# Patient Record
Sex: Female | Born: 1955
Health system: Southern US, Community
[De-identification: ages and names within clinical notes are randomized; demographics above are authoritative.]

## PROBLEM LIST (undated history)

## (undated) DIAGNOSIS — C801 Malignant (primary) neoplasm, unspecified: Secondary | ICD-10-CM

## (undated) DIAGNOSIS — H1851 Endothelial corneal dystrophy: Secondary | ICD-10-CM

## (undated) DIAGNOSIS — E785 Hyperlipidemia, unspecified: Secondary | ICD-10-CM

## (undated) DIAGNOSIS — Z923 Personal history of irradiation: Secondary | ICD-10-CM

## (undated) DIAGNOSIS — C50919 Malignant neoplasm of unspecified site of unspecified female breast: Secondary | ICD-10-CM

## (undated) DIAGNOSIS — H18519 Endothelial corneal dystrophy, unspecified eye: Secondary | ICD-10-CM

## (undated) DIAGNOSIS — Z9221 Personal history of antineoplastic chemotherapy: Secondary | ICD-10-CM

## (undated) HISTORY — PX: NASAL SEPTUM SURGERY: SHX37

## (undated) HISTORY — PX: EXCISION MASS NECK: SHX6703

## (undated) HISTORY — DX: Endothelial corneal dystrophy, unspecified eye: H18.519

## (undated) HISTORY — DX: Malignant neoplasm of unspecified site of unspecified female breast: C50.919

## (undated) HISTORY — DX: Hyperlipidemia, unspecified: E78.5

## (undated) HISTORY — PX: APPENDECTOMY: SHX54

## (undated) HISTORY — PX: ENDOSCOPIC INSERTION PERITONEAL CATHETER PORT: SUR440

## (undated) HISTORY — DX: Endothelial corneal dystrophy: H18.51

## (undated) HISTORY — DX: Malignant (primary) neoplasm, unspecified: C80.1

---

## 2000-06-01 ENCOUNTER — Other Ambulatory Visit: Admission: RE | Admit: 2000-06-01 | Discharge: 2000-06-01 | Payer: Self-pay | Admitting: Internal Medicine

## 2001-12-07 ENCOUNTER — Encounter: Admission: RE | Admit: 2001-12-07 | Discharge: 2001-12-07 | Payer: Self-pay

## 2002-01-11 ENCOUNTER — Encounter: Payer: Self-pay | Admitting: Orthopaedic Surgery

## 2002-01-11 ENCOUNTER — Encounter: Admission: RE | Admit: 2002-01-11 | Discharge: 2002-01-11 | Payer: Self-pay | Admitting: Orthopaedic Surgery

## 2012-01-18 DIAGNOSIS — C50919 Malignant neoplasm of unspecified site of unspecified female breast: Secondary | ICD-10-CM

## 2012-01-18 HISTORY — DX: Malignant neoplasm of unspecified site of unspecified female breast: C50.919

## 2012-07-04 ENCOUNTER — Other Ambulatory Visit: Payer: Self-pay | Admitting: Unknown Physician Specialty

## 2012-07-04 DIAGNOSIS — C50911 Malignant neoplasm of unspecified site of right female breast: Secondary | ICD-10-CM

## 2012-07-06 ENCOUNTER — Encounter (INDEPENDENT_AMBULATORY_CARE_PROVIDER_SITE_OTHER): Payer: BC Managed Care – PPO | Admitting: General Surgery

## 2012-07-11 ENCOUNTER — Encounter (INDEPENDENT_AMBULATORY_CARE_PROVIDER_SITE_OTHER): Payer: Self-pay

## 2012-07-14 ENCOUNTER — Ambulatory Visit
Admission: RE | Admit: 2012-07-14 | Discharge: 2012-07-14 | Disposition: A | Discharge status: Home / Self Care | Payer: BC Managed Care – PPO | Source: Ambulatory Visit | Attending: Unknown Physician Specialty | Admitting: Unknown Physician Specialty

## 2012-07-14 DIAGNOSIS — C50911 Malignant neoplasm of unspecified site of right female breast: Secondary | ICD-10-CM

## 2012-07-14 MED ORDER — GADOBENATE DIMEGLUMINE 529 MG/ML IV SOLN
16.0000 mL | Freq: Once | INTRAVENOUS | Status: AC | PRN
  Administered 2012-07-14: 16 mL via INTRAVENOUS

## 2012-07-17 ENCOUNTER — Ambulatory Visit (INDEPENDENT_AMBULATORY_CARE_PROVIDER_SITE_OTHER): Payer: BC Managed Care – PPO | Admitting: Surgery

## 2012-07-17 ENCOUNTER — Encounter (INDEPENDENT_AMBULATORY_CARE_PROVIDER_SITE_OTHER): Payer: Self-pay | Admitting: Surgery

## 2012-07-17 VITALS — BP 130/80 | HR 84 | Resp 16 | Ht 66.0 in | Wt 177.2 lb

## 2012-07-17 DIAGNOSIS — C50919 Malignant neoplasm of unspecified site of unspecified female breast: Secondary | ICD-10-CM

## 2012-07-17 DIAGNOSIS — C50911 Malignant neoplasm of unspecified site of right female breast: Secondary | ICD-10-CM

## 2012-07-17 NOTE — Patient Instructions (Signed)
We will arrange a consultation with a medical oncologist, radiation oncologist, and a genetic counselor. Once you have seen them, we can make a definitive decision about surgical therapy.

## 2012-07-17 NOTE — Progress Notes (Signed)
Patient ID: Rebecca Hensley, female   DOB: 1955-06-02, 57 y.o.   MRN: [id number]  Chief Complaint  Patient presents with  . Breast Cancer    Right    HPI Rebecca Hensley is a 57 y.o. female.  She recently had a mammogram done and a mass was found in the right breast upper-outer quadrant. A needle core biopsy has shown invasive ductal carcinoma, confirmed by an e-cahherin stain.visit receptors have been done I don't have that report.q she is asymptomatic. She's never had any breast problems. She has a sister currently on treatment for a second breast cancer at approximately age 24 and her mother has had 2 breast cancers. Reportedly the sister has been tested negative for BRCA gene.  HPI  Past Medical History  Diagnosis Date  . Cancer   . Hyperlipidemia     Past Surgical History  Procedure Laterality Date  . Appendectomy    . Nasal septum surgery      Family History  Problem Relation Age of Onset  . Cancer Mother     breast  . Cancer Father     stomach  . Cancer Sister     breast    Social History History  Substance Use Topics  . Smoking status: Former Smoker    Quit date: 07/18/2002  . Smokeless tobacco: Not on file  . Alcohol Use: No    No Known Allergies  Current Outpatient Prescriptions  Medication Sig Dispense Refill  . COMBIPATCH 0.05-0.14 MG/DAY       . diclofenac (VOLTAREN) 75 MG EC tablet       . DULoxetine (CYMBALTA) 60 MG capsule       . lovastatin (MEVACOR) 20 MG tablet Take 20 mg by mouth at bedtime.      . montelukast (SINGULAIR) 10 MG tablet       . omeprazole (PRILOSEC) 20 MG capsule       . oxybutynin (DITROPAN) 5 MG tablet       . pramipexole (MIRAPEX) 1.5 MG tablet        No current facility-administered medications for this visit.    Review of Systems Review of Systems  Constitutional: Negative for fever, chills and unexpected weight change.  HENT: Negative for hearing loss, congestion, sore throat, trouble swallowing and voice change.    Eyes: Negative for visual disturbance.  Respiratory: Negative for cough and wheezing.   Cardiovascular: Negative for chest pain, palpitations and leg swelling.  Gastrointestinal: Negative for nausea, vomiting, abdominal pain, diarrhea, constipation, blood in stool, abdominal distention and anal bleeding.  Genitourinary: Negative for hematuria, vaginal bleeding and difficulty urinating.  Musculoskeletal: Negative for arthralgias.  Skin: Negative for rash and wound.  Neurological: Negative for seizures, syncope and headaches.  Hematological: Negative for adenopathy. Does not bruise/bleed easily.  Psychiatric/Behavioral: Negative for confusion.    Blood pressure 130/80, pulse 84, resp. rate 16, height 5\' 6"  (1.676 m), weight 177 lb 3.2 oz (80.377 kg).  Physical Exam Physical Exam  Vitals reviewed. Constitutional: She is oriented to person, place, and time. She appears well-developed and well-nourished. No distress.  HENT:  Head: Normocephalic and atraumatic.  Mouth/Throat: Oropharynx is clear and moist.  Eyes: Conjunctivae and EOM are normal. Pupils are equal, round, and reactive to light. No scleral icterus.  Neck: Normal range of motion. Neck supple. No tracheal deviation present. No thyromegaly present.  Cardiovascular: Normal rate, regular rhythm, normal heart sounds and intact distal pulses.  Exam reveals no gallop and no friction rub.  No murmur heard. Pulmonary/Chest: Effort normal and breath sounds normal. No respiratory distress. She has no wheezes. She has no rales. Right breast exhibits no inverted nipple, no mass, no nipple discharge, no skin change and no tenderness. Left breast exhibits no inverted nipple, no mass, no nipple discharge, no skin change and no tenderness. Breasts are symmetrical.  Abdominal: Soft. Bowel sounds are normal. She exhibits no distension and no mass. There is no tenderness. There is no rebound and no guarding.  Musculoskeletal: Normal range of motion.  She exhibits no edema and no tenderness.  Lymphadenopathy:    She has no cervical adenopathy.    She has no axillary adenopathy.       Right: No supraclavicular adenopathy present.       Left: No supraclavicular adenopathy present.  Neurological: She is alert and oriented to person, place, and time.  Skin: Skin is warm and dry. No rash noted. She is not diaphoretic. No erythema.  Psychiatric: She has a normal mood and affect. Her behavior is normal. Judgment and thought content normal.    Data Reviewed I have reviewed the reports from her primary care physician, the pathology reports, the mammogram and MRI reports, and other notes. ER 90%, PR=?, Mib1=28%, Her2= 1=> Assessment    Clinical stage II right breast cancer upper outer quadrant     Plan    I have explained the pathophysiology and staging of breast cancer with particular attention to her exact situation. We discussed the multidisciplinary approach to breast cancer which often includes both medical and radiation oncology consultations.  We also discussed surgical options for the treatment of breast cancer including lumpectomy and mastectomy with possible reconstructive surgery. In addition we talked about the evaluation and management of lymph nodes including a description of sentinel lymph node biopsy and axillary dissections. We reviewed potential complications and risks including bleeding, infection, numbness,  lymphedema, and the potential need for additional surgery.  She understands that for patients who are candidate for lumpectomy or mastectomy there is an equal survival rate with either technique, but a slightly higher local recurrence rate with lumpectomy. In addition she knows that a lumpectomy usually requires postoperative radiation as part of the management of the breast cancer.  We have discussed the likely postoperative course and plans for followup.  I have given the patient some written information that reviewed  all of these issues. I believe her questions are answered and that she has a good understanding of the issues.  Currently plan to go ahead with a medical and radiation oncology consultation and a genetic counselor appointment prior to making a definitive decision about surgical interventions.        Gia Lusher J 07/17/2012, 4:22 PM

## 2012-07-19 ENCOUNTER — Telehealth (INDEPENDENT_AMBULATORY_CARE_PROVIDER_SITE_OTHER): Payer: Self-pay

## 2012-07-19 NOTE — Telephone Encounter (Signed)
Pt calling b/c she has not heard [facility] about her appt's for medical,radiation,and genetic appt's. Pls call to let her know they are working on the appt's.

## 2012-07-25 ENCOUNTER — Other Ambulatory Visit (INDEPENDENT_AMBULATORY_CARE_PROVIDER_SITE_OTHER): Payer: Self-pay

## 2012-07-25 DIAGNOSIS — C50911 Malignant neoplasm of unspecified site of right female breast: Secondary | ICD-10-CM

## 2012-07-30 ENCOUNTER — Telehealth (INDEPENDENT_AMBULATORY_CARE_PROVIDER_SITE_OTHER): Payer: Self-pay

## 2012-07-30 ENCOUNTER — Telehealth: Payer: Self-pay | Admitting: *Deleted

## 2012-07-30 ENCOUNTER — Other Ambulatory Visit (INDEPENDENT_AMBULATORY_CARE_PROVIDER_SITE_OTHER): Payer: Self-pay | Admitting: Surgery

## 2012-07-30 ENCOUNTER — Encounter (INDEPENDENT_AMBULATORY_CARE_PROVIDER_SITE_OTHER): Payer: Self-pay

## 2012-07-30 DIAGNOSIS — C50911 Malignant neoplasm of unspecified site of right female breast: Secondary | ICD-10-CM

## 2012-07-30 NOTE — Telephone Encounter (Signed)
Dawn called to ask for [place] to put the medical oncology and genetics referral in place. I redid another other for medical and genetics referral.

## 2012-07-30 NOTE — Telephone Encounter (Signed)
Confirmed 08/02/12 appt w/ pt.  Unable to mail before appt letter - gave verbal.  Unable to mail packet - put note under appt notes for pt to receive one at time of registration.  Confirmed 08/07/12 genetic appt w/ pt.  Emailed Clydie Braun to make aware of added genetic appt.  Emailed Alisha at [facility] to make her aware of these appts as well as to request for her to send a referral to [facility] in [place] for the Rad Onc appt.  Pt wishes to see Rad Onc there.  Took paperwork to Med Rec for chart.

## 2012-08-02 ENCOUNTER — Ambulatory Visit: Payer: BC Managed Care – PPO

## 2012-08-02 ENCOUNTER — Telehealth (INDEPENDENT_AMBULATORY_CARE_PROVIDER_SITE_OTHER): Payer: Self-pay | Admitting: Surgery

## 2012-08-02 ENCOUNTER — Ambulatory Visit: Payer: BC Managed Care – PPO | Admitting: Oncology

## 2012-08-02 ENCOUNTER — Other Ambulatory Visit: Payer: BC Managed Care – PPO | Admitting: Lab

## 2012-08-02 ENCOUNTER — Telehealth: Payer: Self-pay | Admitting: *Deleted

## 2012-08-02 ENCOUNTER — Other Ambulatory Visit: Payer: Self-pay | Admitting: Emergency Medicine

## 2012-08-02 DIAGNOSIS — C50911 Malignant neoplasm of unspecified site of right female breast: Secondary | ICD-10-CM

## 2012-08-02 NOTE — Telephone Encounter (Signed)
Pt called requesting to cancel her appts for Dr. Welton Flakes today due to going to [facility].  Asked her if she wanted to keep or cancel the genetic appt set for 7/22 and she said to keep for right now and if she wanted to change that then she would call us to let us know.

## 2012-08-02 NOTE — Telephone Encounter (Signed)
Staff received a call that she has cancelled appointment with Dr Welton Flakes and is going to [facility] for evaluation

## 2012-08-07 ENCOUNTER — Ambulatory Visit (HOSPITAL_BASED_OUTPATIENT_CLINIC_OR_DEPARTMENT_OTHER): Payer: BC Managed Care – PPO | Admitting: Genetic Counselor

## 2012-08-07 ENCOUNTER — Other Ambulatory Visit: Payer: BC Managed Care – PPO | Admitting: Lab

## 2012-08-07 ENCOUNTER — Encounter: Payer: Self-pay | Admitting: Genetic Counselor

## 2012-08-07 DIAGNOSIS — C50911 Malignant neoplasm of unspecified site of right female breast: Secondary | ICD-10-CM

## 2012-08-07 DIAGNOSIS — C50919 Malignant neoplasm of unspecified site of unspecified female breast: Secondary | ICD-10-CM

## 2012-08-07 NOTE — Progress Notes (Signed)
Dr.  Drue Second requested a consultation for genetic counseling and risk assessment for Rebecca Hensley, a 57 y.o. female, for discussion of her personal history of breast cancer and family history of bilateral breast cancer, prostate and stomach cancer.  She presents to clinic today to discuss the possibility of a genetic predisposition to cancer, and to further clarify her risks, as well as her family members' risks for cancer.   HISTORY OF PRESENT ILLNESS: In 2014, at the age of 39, Rebecca Hensley was diagnosed with invasive ductal carcinoma of the right breast. This will be treated with either a lumpectomy or mastectomy.  She has had a colonoscopy in the past which did not reveal polyps.  Her sister was tested for BRCA mutations in the past and was negative, and is considering pursuing additional genetic testing.    Past Medical History  Diagnosis Date  . Cancer   . Hyperlipidemia   . Breast cancer 2014    Past Surgical History  Procedure Laterality Date  . Appendectomy    . Nasal septum surgery      History   Social History  . Marital Status: Married    Spouse Name: N/A    Number of Children: 2  . Years of Education: N/A   Occupational History  .     Social History Main Topics  . Smoking status: Former Smoker -- 1.00 packs/day for 30 years    Quit date: 07/18/2002  . Smokeless tobacco: None  . Alcohol Use: No  . Drug Use: No  . Sexually Active: None   Other Topics Concern  . None   Social History Narrative  . None    REPRODUCTIVE HISTORY AND PERSONAL RISK ASSESSMENT FACTORS: Menarche was at age 64.   postmenopausal Uterus Intact: yes Ovaries Intact: yes G2P2A0, first live birth at age 71  She has not previously undergone treatment for infertility.   Oral Contraceptive use: 3 years   She has not used HRT in the past.    FAMILY HISTORY:  We obtained a detailed, 4-generation family history.  Significant diagnoses are listed below: Family History   Problem Relation Age of Onset  . Breast cancer Mother 58    bilateral cancer, second diagnosis at 49  . Stomach cancer Father 73  . Breast cancer Sister 55    bilateral breast cancer, second dx at 24  . Breast cancer Maternal Aunt     dx in her 87s  . Prostate cancer Maternal Grandfather     dx in his 45s-60s  . Lung cancer Sister 14    smoker  . Breast cancer Maternal Aunt     dx in her 95s  . Breast cancer Cousin     maternal cousin  . Breast cancer Cousin     maternal cousin  . Breast cancer Paternal Aunt 6  . Bone cancer Paternal Uncle     thinks the cancer started somewhere else  . Breast cancer Paternal Aunt     dx in her 61s  . Cancer Cousin     dx in her 44s, a "female" cancer    Patient's ancestors are of unknown descent. There is no reported Ashkenazi Jewish ancestry. There is no  known consanguinity.  GENETIC COUNSELING ASSESSMENT: Rebecca Hensley is a 57 y.o. female with a personal history of breast cancer and family history of breast, prostate, stomach and bone cancer which somewhat suggestive of a hereditary breast cancer syndrome and predisposition to cancer.  We, therefore, discussed and recommended the following at today's visit.   DISCUSSION: We reviewed the characteristics, features and inheritance patterns of hereditary cancer syndromes. We also discussed genetic testing, including the appropriate family members to test, the process of testing, insurance coverage and turn-around-time for results. We recommended Rebecca Hensley pursue genetic testing for breast and ovarian cancer panel at [facility].   PLAN: After considering the risks, benefits, and limitations, Rebecca Hensley provided informed consent to pursue genetic testing and the blood sample will be sent to [facility] for analysis of the Breast/ovarian cancer panel. We discussed the implications of a positive, negative and/ or variant of uncertain significance genetic test result. Results  should be available within approximately 3-4 weeks' time, at which point they will be disclosed by telephone to Rebecca Hensley, as will any additional her recommendations warranted by these results. Rebecca Hensley will receive a summary of her genetic counseling visit and a copy of her results once available. This information will also be available in Epic. We encouraged Rebecca Hensley to remain in contact with cancer genetics annually so that we can continuously update the family history and inform her of any changes in cancer genetics and testing that may be of benefit for her family. Rebecca Hensley's questions were answered to her satisfaction today. Our contact information was provided should additional questions or concerns arise.  Per the patient's request, we will contact her by telephone to discuss these results. A follow up genetic counseling visit will be scheduled if indicated.  The patient was seen for a total of 60 minutes, greater than 50% of which was spent face-to-face counseling.  This plan is being carried out per Dr. Feliz Beam recommendations.  This note will also be sent to the referring provider via the electronic medical record. The patient will be supplied with a summary of this genetic counseling discussion as well as educational information on the discussed hereditary cancer syndromes following the conclusion of their visit.   Patient was discussed with Dr. Drue Second.   _______________________________________________________________________ For Office Staff:  Number of people involved in session: 4 Was an Intern/ student involved with case: no

## 2012-08-22 ENCOUNTER — Telehealth: Payer: Self-pay | Admitting: Genetic Counselor

## 2012-08-22 ENCOUNTER — Encounter: Payer: Self-pay | Admitting: Genetic Counselor

## 2012-08-22 NOTE — Telephone Encounter (Signed)
Revealed negative genetic test results 

## 2012-09-12 ENCOUNTER — Other Ambulatory Visit: Payer: Self-pay | Admitting: Oncology

## 2012-10-09 ENCOUNTER — Other Ambulatory Visit (HOSPITAL_COMMUNITY): Payer: Self-pay | Admitting: *Deleted

## 2012-10-09 DIAGNOSIS — R945 Abnormal results of liver function studies: Secondary | ICD-10-CM

## 2012-10-12 ENCOUNTER — Ambulatory Visit (HOSPITAL_COMMUNITY): Payer: BC Managed Care – PPO

## 2012-10-12 ENCOUNTER — Ambulatory Visit (HOSPITAL_COMMUNITY)
Admission: RE | Admit: 2012-10-12 | Discharge: 2012-10-12 | Disposition: A | Discharge status: Home / Self Care | Payer: BC Managed Care – PPO | Source: Ambulatory Visit | Attending: Nurse Practitioner | Admitting: Nurse Practitioner

## 2012-10-12 DIAGNOSIS — R945 Abnormal results of liver function studies: Secondary | ICD-10-CM

## 2012-10-12 DIAGNOSIS — R7989 Other specified abnormal findings of blood chemistry: Secondary | ICD-10-CM | POA: Insufficient documentation

## 2012-10-12 DIAGNOSIS — C50919 Malignant neoplasm of unspecified site of unspecified female breast: Secondary | ICD-10-CM | POA: Insufficient documentation

## 2012-10-15 ENCOUNTER — Encounter (INDEPENDENT_AMBULATORY_CARE_PROVIDER_SITE_OTHER): Payer: Self-pay

## 2012-10-16 ENCOUNTER — Encounter (INDEPENDENT_AMBULATORY_CARE_PROVIDER_SITE_OTHER): Payer: Self-pay

## 2012-11-15 ENCOUNTER — Telehealth: Payer: Self-pay | Admitting: *Deleted

## 2012-11-15 NOTE — Telephone Encounter (Signed)
Pt called wanting an appt for KK. gv appt for 11/19/12 @ 3pm...td

## 2012-11-19 ENCOUNTER — Encounter: Payer: Self-pay | Admitting: Oncology

## 2012-11-19 ENCOUNTER — Encounter (INDEPENDENT_AMBULATORY_CARE_PROVIDER_SITE_OTHER): Payer: Self-pay

## 2012-11-19 ENCOUNTER — Ambulatory Visit (HOSPITAL_BASED_OUTPATIENT_CLINIC_OR_DEPARTMENT_OTHER): Payer: BC Managed Care – PPO | Admitting: Oncology

## 2012-11-19 ENCOUNTER — Telehealth: Payer: Self-pay | Admitting: *Deleted

## 2012-11-19 VITALS — BP 114/69 | HR 84 | Temp 98.4°F | Resp 20 | Ht 66.0 in | Wt 174.2 lb

## 2012-11-19 DIAGNOSIS — C50911 Malignant neoplasm of unspecified site of right female breast: Secondary | ICD-10-CM

## 2012-11-19 DIAGNOSIS — Z170 Estrogen receptor positive status [ER+]: Secondary | ICD-10-CM

## 2012-11-19 DIAGNOSIS — Z9010 Acquired absence of unspecified breast and nipple: Secondary | ICD-10-CM

## 2012-11-19 DIAGNOSIS — C50919 Malignant neoplasm of unspecified site of unspecified female breast: Secondary | ICD-10-CM

## 2012-11-19 NOTE — Patient Instructions (Signed)
We will check your bloodwork on 11/4  I have sent a prescription of arimidex to your pharmacy   Please begin taper of cymbalta as follows:  30 mg daily for 2 weeks   30 mg every other day for 2 weeks  30 mg every 2 days for 2 weeks  Anastrozole tablets What is this medicine? ANASTROZOLE (an AS troe zole) is used to treat breast cancer in women who have gone through menopause. Some types of breast cancer depend on estrogen to grow, and this medicine can stop tumor growth by blocking estrogen production. This medicine may be used for other purposes; ask your health care provider or pharmacist if you have questions. What should I tell my health care provider before I take this medicine? They need to know if you have any of these conditions: -liver disease -an unusual or allergic reaction to anastrozole, other medicines, foods, dyes, or preservatives -pregnant or trying to get pregnant -breast-feeding How should I use this medicine? Take this medicine by mouth with a glass of water. Follow the directions on the prescription label. You can take this medicine with or without food. Take your doses at regular intervals. Do not take your medicine more often than directed. Do not stop taking except on the advice of your doctor or health care professional. Talk to your pediatrician regarding the use of this medicine in children. Special care may be needed. Overdosage: If you think you have taken too much of this medicine contact a poison control center or emergency room at once. NOTE: This medicine is only for you. Do not share this medicine with others. What if I miss a dose? If you miss a dose, take it as soon as you can. If it is almost time for your next dose, take only that dose. Do not take double or extra doses. What may interact with this medicine? Do not take this medicine with any of the following medications: -female hormones, like estrogens or progestins and birth control pills This  medicine may also interact with the following medications: -tamoxifen This list may not describe all possible interactions. Give your health care provider a list of all the medicines, herbs, non-prescription drugs, or dietary supplements you use. Also tell them if you smoke, drink alcohol, or use illegal drugs. Some items may interact with your medicine. What should I watch for while using this medicine? Visit your doctor or health care professional for regular checks on your progress. Let your doctor or health care professional know about any unusual vaginal bleeding. Do not treat yourself for diarrhea, nausea, vomiting or other side effects. Ask your doctor or health care professional for advice. What side effects may I notice from receiving this medicine? Side effects that you should report to your doctor or health care professional as soon as possible: -allergic reactions like skin rash, itching or hives, swelling of the face, lips, or tongue -any new or unusual symptoms -breathing problems -chest pain -leg pain or swelling -vomiting Side effects that usually do not require medical attention (report to your doctor or health care professional if they continue or are bothersome): -back or bone pain -cough, or throat infection -diarrhea or constipation -dizziness -headache -hot flashes -loss of appetite -nausea -sweating -weakness and tiredness -weight gain This list may not describe all possible side effects. Call your doctor for medical advice about side effects. You may report side effects to FDA at 1-800-FDA-1088. Where should I keep my medicine? Keep out of the reach of  children. Store at room temperature between 20 and 25 degrees C (68 and 77 degrees F). Throw away any unused medicine after the expiration date. NOTE: This sheet is a summary. It may not cover all possible information. If you have questions about this medicine, talk to your doctor, pharmacist, or health care  provider.  2013, [employer]. (03/16/2007 4:31:52 PM)

## 2012-11-19 NOTE — Telephone Encounter (Signed)
appts made and printed...td 

## 2012-11-19 NOTE — Progress Notes (Signed)
Rebecca Hensley [id number] [birth year] 57 y.o. 11/19/2012 3:56 PM  CC  Donzetta Sprung, MD [address] [facility] [place] [address] Dr, Laren Everts  REASON FOR CONSULTATION:  57 year old female with new diagnosis of stage 2 invasive ductal carcinoma of the right breast status post mastectomy. Patient is seen in medical oncology for discussion of adjuvant treatment options.  STAGE:  T2 N0 Invasive ductal carcinoma, right breast ER+/PR+/HER-2/neu- Stage II   REFERRING PHYSICIAN: Dr. Laren Everts  HISTORY OF PRESENT ILLNESS:  Rebecca Hensley is a 57 y.o. female.  Was a sister with history of breast cancer. In July 2014 patient had a mammogram done that revealed a mass in her right breast in the upper outer quadrant. She had a needle core biopsy performed that showed invasive ductal carcinoma. The tumor was ER positive PR positive. Because of this she was seen by Dr. Cyndia Bent. Thereafter she obtained a second opinion at [facility] and was seen by Dr. Laren Everts. Since then patient has undergone a right mastectomy with sentinel lymph node biopsy performed September 12 2012. Patient was recommended to followup with oncology and she is now seen here for further discussion of adjuvant treatment options.  Patient has been experiencing significant hot flashes. She states that she has been going to a complimentary medicine physician in [place]. She is on by identical hormones including estrogen and progesterone.  The patient was seen by genetics and underwent genetic counseling. She did have testing performed for the BRCA1 and BRCA2 gene mutation/panel and she was found to be genetically negative.   Past Medical History: Past Medical History  Diagnosis Date  . Cancer   . Hyperlipidemia   . Breast cancer 2014    Past Surgical History: Past Surgical History  Procedure Laterality Date  . Appendectomy    . Nasal septum surgery      Family History: Family History  Problem  Relation Age of Onset  . Breast cancer Mother 5    bilateral cancer, second diagnosis at 28  . Stomach cancer Father 20  . Breast cancer Sister 72    bilateral breast cancer, second dx at 19  . Breast cancer Maternal Aunt     dx in her 18s  . Prostate cancer Maternal Grandfather     dx in his 34s-60s  . Lung cancer Sister 46    smoker  . Breast cancer Maternal Aunt     dx in her 29s  . Breast cancer Cousin     maternal cousin  . Breast cancer Cousin     maternal cousin  . Breast cancer Paternal Aunt 71  . Bone cancer Paternal Uncle     thinks the cancer started somewhere else  . Breast cancer Paternal Aunt     dx in her 27s  . Cancer Cousin     dx in her 55s, a "female" cancer    Social History History  Substance Use Topics  . Smoking status: Former Smoker -- 1.00 packs/day for 30 years    Quit date: 07/18/2002  . Smokeless tobacco: Not on file  . Alcohol Use: No    Allergies: No Known Allergies  Current Medications: Current Outpatient Prescriptions  Medication Sig Dispense Refill  . DULoxetine (CYMBALTA) 60 MG capsule       . lovastatin (MEVACOR) 20 MG tablet Take 20 mg by mouth at bedtime.      . montelukast (SINGULAIR) 10 MG tablet       . oxybutynin (DITROPAN)  5 MG tablet       . pramipexole (MIRAPEX) 1.5 MG tablet       . diclofenac (VOLTAREN) 75 MG EC tablet        No current facility-administered medications for this visit.    OB/GYN History: menarche at 47, menoapause at 43, HRT x3 years, age at first live birth 45, G23P2  Fertility Discussion: n/a Prior History of Cancer: no  Health Maintenance:  Colonoscopy yes 4 years ago,  Bone Density none Last PAP smear 1 1/2 years  ECOG PERFORMANCE STATUS: 0 - Asymptomatic  Genetic Counseling/testing: yes performed and negative  REVIEW OF SYSTEMS:  A comprehensive review of systems was negative.  PHYSICAL EXAMINATION: Blood pressure 114/69, pulse 84, temperature 98.4 F (36.9 C), temperature  source Oral, resp. rate 20, height 5\' 6"  (1.676 m), weight 174 lb 3.2 oz (79.017 kg).  WUJ:WJXBJ, healthy, no distress, well nourished and well developed SKIN: skin color, texture, turgor are normal HEAD: Normocephalic EYES: PERRLA, EOMI, Conjunctiva are pink and non-injected EARS: External ears normal OROPHARYNX:no exudate and no erythema  NECK: supple, no adenopathy LYMPH:  no palpable lymphadenopathy, no hepatosplenomegaly BREAST:left breast normal without mass, skin or nipple changes or axillary nodes, right mastectomy site is well-healed no evidence of local recurrence or infections LUNGS: clear to auscultation and percussion HEART: regular rate & rhythm ABDOMEN:abdomen soft, non-tender, normal bowel sounds and no masses or organomegaly BACK: Back symmetric, no curvature., No CVA tenderness EXTREMITIES:less then 2 second capillary refill, no edema, no clubbing, no cyanosis  NEURO: alert & oriented x 3 with fluent speech, no focal motor/sensory deficits, gait normal, reflexes normal and symmetric     STUDIES/RESULTS: No results found.   LABS:    Chemistry   No results found for this basename: NA, K, CL, CO2, BUN, CREATININE, GLU   No results found for this basename: CALCIUM, ALKPHOS, AST, ALT, BILITOT      No results found for this basename: WBC, HGB, HCT, MCV, PLT   PATHOLOGY: A.  SENTINEL LYMPH NODE, RIGHT AXILLA, #1, BIOPSY:      ONE LYMPH NODE, NEGATIVE FOR MALIGNANCY (0/1).   B.  SENTINEL LYMPH NODE, RIGHT AXILLA, #2, BIOPSY:      ONE LYMPH NODE, NEGATIVE FOR MALIGNANCY (0/1).   C.  SENTINEL LYMPH NODE, RIGHT AXILLA, #3, BIOPSY:      ONE LYMPH NODE, NEGATIVE FOR MALIGNANCY (0/1).   D.  BREAST, RIGHT, SIMPLE MASTECTOMY:      RESIDUAL INVASIVE ADENOCARCINOMA OF THE BREAST.        HISTOLOGIC TYPE: DUCTAL WITH PROMINENT LOBULAR FEATURES.        NOTTINGHAM COMBINED HISTOLOGIC GRADE: 2 OF 3.           TUBULE FORMATION SCORE: 3           NUCLEAR PLEOMORPHISM  SCORE: 2           MITOTIC RATE SCORE: 1        GROSS TUMOR SIZE: 2.1 X 2 X 1.8 CM.        SIZE OF INVASIVE COMPONENT: 2 CM.        LOCATION OF THE TUMOR: ADJACENT TO PREVIOUS BIOPSY SITE.        LYMPHATIC/VASCULAR INVASION: ABSENT.        MULTIFOCAL TUMOR: ABSENT.        TREATMENT EFFECT: NO KNOWN PRE-SURGICAL THERAPY.      IN-SITU CARCINOMA: PRESENT.        TYPE OF IN-SITU CARCINOMA: SOLID.  NUCLEAR GRADE OF IN-SITU CARCINOMA: 2 OF 3.        NECROSIS: ABSENT.        DCIS EXTENDING OUTSIDE INVASIVE TUMOR MASS: ABSENT.        SIZE OF IN-SITU CARCINOMA: NOT APPLICABLE.      NIPPLE STATUS: FREE OF TUMOR.     SKIN STATUS: FREE OF TUMOR.     MUSCLE STATUS: NOT SAMPLED.     STATUS OF NON-NEOPLASTIC BREAST TISSUE:     - BIOPSY SITE CHANGES.     - FIBROCYSTIC CHANGES.     - COLUMNAR CELL CHANGES.     SURGICAL MARGIN STATUS: NEGATIVE.     CLOSEST MARGIN: POSTERIOR.     DISTANCE TO CLOSEST MARGIN: 0.9 CM.      AXILLARY LYMPH NODE STATUS: NO LYMPH NODES IN THIS SPECIMEN.      BREAST CANCER BIOMARKER STUDIES:  NOT PERFORMED.   ASSESSMENT    57 year old female with  #1 T2 N0 invasive ductal carcinoma of the right breast, grade 2 ER positive PR positive HER-2/neu negative. Patient is status post right mastectomy with sentinel lymph node biopsy on 09/12/2012. Postoperatively patient is doing well. She was last seen by her surgeon on 11/05/2012. She was recommended to followup with [place]. She is now here to discuss treatment options.  #2 patient and I and her husband went over her pathology in detail today. We discussed the pathophysiology of breast cancer. We discussed treatment options. Since patient has had her surgery and her tumor is a T2 measuring at 2.0 cm I have recommended that we do Oncotype DX testing to determine her breast cancer recurrence score. I explained to her that if her score is in the low risk category then she would be a candidate for antiestrogen therapy alone.  However if it is in the intermediate or high risk then I would recommend doing adjuvant chemotherapy followed by antiestrogen therapy. We discussed rationale for Oncotype DX testing. We also discussed rationale for chemotherapy and antiestrogen therapy. We discussed side effects risks and benefits.  #3 patient had some blood work copies on her went over this. She is noted to have elevation in her LFTs this is concerning. I have recommended repeating her blood work here. She will come back tomorrow for this for further evaluation. If her LFTs remain elevated then my plan would be to do imaging studies such as an ultrasound for further evaluation of her liver.  #4 patient is on complementary medicine including bio identical hormones (estrogen and progesterone). I explained to her that she may need to come off of these due to the fact that her tumor is ER/PR positive and she may be feeding possible microscopic disease. She is going to think about this.  #5 patient will not need radiation therapy since she has had a mastectomy and she is no negative.  Clinical Trial Eligibility: no Multidisciplinary conference discussion no     PLAN:    #1 proceed with genomic testing for Oncotype DX for breast cancer recurrence score.  #2 we will repeat her liver function studies tomorrow.  #3 I will see her back in a few weeks time after I have the results of her Oncotype testing.       Discussion: Patient is being treated per NCCN breast cancer care guidelines appropriate for stage.II   Thank you so much for allowing me to participate in the care of Rebecca Hensley. I will continue to follow up the patient with you and  assist in her care.  All questions were answered. The patient knows to call the clinic with any problems, questions or concerns. We can certainly see the patient much sooner if necessary.  I spent 60 minutes counseling the patient face to face. The total time spent in the appointment was  60 minutes.  Drue Second, MD Medical/Oncology [facility] [phone] (beeper) [phone] (Office)   11/19/2012, 3:56 PM

## 2012-11-20 ENCOUNTER — Other Ambulatory Visit (HOSPITAL_BASED_OUTPATIENT_CLINIC_OR_DEPARTMENT_OTHER): Payer: BC Managed Care – PPO | Admitting: Lab

## 2012-11-20 DIAGNOSIS — C50911 Malignant neoplasm of unspecified site of right female breast: Secondary | ICD-10-CM

## 2012-11-20 DIAGNOSIS — C50919 Malignant neoplasm of unspecified site of unspecified female breast: Secondary | ICD-10-CM

## 2012-11-20 LAB — COMPREHENSIVE METABOLIC PANEL (CC13)
AST: 19 U/L (ref 5–34)
Anion Gap: 12 mEq/L — ABNORMAL HIGH (ref 3–11)
BUN: 14.6 mg/dL (ref 7.0–26.0)
Calcium: 9 mg/dL (ref 8.4–10.4)
Chloride: 103 mEq/L (ref 98–109)
Creatinine: 0.8 mg/dL (ref 0.6–1.1)

## 2012-11-20 LAB — CBC WITH DIFFERENTIAL/PLATELET
Basophils Absolute: 0 10*3/uL (ref 0.0–0.1)
EOS%: 1.3 % (ref 0.0–7.0)
HCT: 36.6 % (ref 34.8–46.6)
HGB: 12.1 g/dL (ref 11.6–15.9)
MCH: 28.2 pg (ref 25.1–34.0)
MCV: 85.6 fL (ref 79.5–101.0)
NEUT%: 64.8 % (ref 38.4–76.8)
lymph#: 1.6 10*3/uL (ref 0.9–3.3)

## 2012-11-22 ENCOUNTER — Encounter: Payer: Self-pay | Admitting: *Deleted

## 2012-11-22 NOTE — Progress Notes (Signed)
Oncotype order received by Dr. Welton Flakes.  Sent to pathology.  Received by Beverely Low.  Block requested from [facility].

## 2012-11-27 ENCOUNTER — Encounter: Payer: Self-pay | Admitting: *Deleted

## 2012-11-27 NOTE — Progress Notes (Signed)
Faxed [employer] pts insurance information for PAC.

## 2012-12-19 ENCOUNTER — Other Ambulatory Visit: Payer: Self-pay | Admitting: Emergency Medicine

## 2012-12-19 NOTE — Progress Notes (Signed)
Patient states that she was unable to decrease the dose of her Cymbalta. Per patient Dr Welton Flakes instructed her to take 60mg  every other day instead of daily. Per patient she was unable to tolerate this dose reduction.

## 2012-12-20 ENCOUNTER — Telehealth: Payer: Self-pay | Admitting: *Deleted

## 2012-12-20 NOTE — Telephone Encounter (Signed)
Lm gv appts for 02/27/13 w/labs@ 12:30pm and ov@ 1pm. Made pt aware that i will mail a letter/avs...td

## 2012-12-21 ENCOUNTER — Other Ambulatory Visit: Payer: BC Managed Care – PPO | Admitting: Lab

## 2012-12-21 ENCOUNTER — Ambulatory Visit: Payer: BC Managed Care – PPO | Admitting: Oncology

## 2012-12-24 ENCOUNTER — Other Ambulatory Visit: Payer: Self-pay | Admitting: Emergency Medicine

## 2012-12-26 ENCOUNTER — Encounter: Payer: Self-pay | Admitting: *Deleted

## 2012-12-26 ENCOUNTER — Encounter (HOSPITAL_COMMUNITY): Payer: Self-pay

## 2012-12-26 NOTE — Progress Notes (Signed)
Received Oncotype Dx results of 22.  Placed a copy in Dr. Milta Deiters box.  Took a copy to Med Rec to scan.

## 2013-01-01 ENCOUNTER — Ambulatory Visit (HOSPITAL_BASED_OUTPATIENT_CLINIC_OR_DEPARTMENT_OTHER): Payer: Self-pay | Admitting: Oncology

## 2013-01-01 ENCOUNTER — Telehealth: Payer: Self-pay | Admitting: Oncology

## 2013-01-01 ENCOUNTER — Other Ambulatory Visit: Payer: Self-pay | Admitting: *Deleted

## 2013-01-01 VITALS — BP 130/80 | HR 99 | Temp 98.9°F | Resp 20 | Ht 66.0 in | Wt 177.0 lb

## 2013-01-01 DIAGNOSIS — C50911 Malignant neoplasm of unspecified site of right female breast: Secondary | ICD-10-CM

## 2013-01-01 DIAGNOSIS — C50919 Malignant neoplasm of unspecified site of unspecified female breast: Secondary | ICD-10-CM

## 2013-01-01 DIAGNOSIS — Z9010 Acquired absence of unspecified breast and nipple: Secondary | ICD-10-CM

## 2013-01-01 DIAGNOSIS — Z170 Estrogen receptor positive status [ER+]: Secondary | ICD-10-CM

## 2013-01-01 DIAGNOSIS — R7989 Other specified abnormal findings of blood chemistry: Secondary | ICD-10-CM

## 2013-01-03 ENCOUNTER — Encounter: Payer: Self-pay | Admitting: *Deleted

## 2013-01-04 ENCOUNTER — Telehealth: Payer: Self-pay | Admitting: Oncology

## 2013-01-04 NOTE — Telephone Encounter (Signed)
per 12/18 pof schedule w/Dr. Mariel Sleet or Dr. Ubaldo Glassing. s/w Dawn and per [facility] this is a transfer of care. also per [facility] cx'd 02/27/13 appts here. information for [facility].

## 2013-01-04 NOTE — Progress Notes (Signed)
[name] [id number] [birth year] 57 y.o. 01/04/2013 2:43 AM  CC  Donzetta Sprung, MD [address] [facility] [place] [address] Dr, Laren Everts  : Diagnosis: 57 year old female with new diagnosis of stage 2 invasive ductal carcinoma of the right breast status post mastectomy. Patient is seen in medical oncology for discussion of adjuvant treatment options.  STAGE:  T2 N0 Invasive ductal carcinoma, right breast ER+/PR+/HER-2/neu- Stage II   REFERRING PHYSICIAN: Dr. Laren Everts  Prior oncologic history:  Rebecca Hensley is a 57 y.o. female.  Was a sister with history of breast cancer. In July 2014 patient had a mammogram done that revealed a mass in her right breast in the upper outer quadrant. She had a needle core biopsy performed that showed invasive ductal carcinoma. The tumor was ER positive PR positive. Because of this she was seen by Dr. Cyndia Bent. Thereafter she obtained a second opinion at [facility] and was seen by Dr. Laren Everts. Since then patient has undergone a right mastectomy with sentinel lymph node biopsy performed September 12 2012. Patient was recommended to followup with oncology and she is now seen here for further discussion of adjuvant treatment options.  Patient has been experiencing significant hot flashes. She states that she has been going to a complimentary medicine physician in [place]. She is on by identical hormones including estrogen and progesterone.  The patient was seen by genetics and underwent genetic counseling. She did have testing performed for the BRCA1 and BRCA2 gene mutation/panel and she was found to be genetically negative.  Current therapy: Possibly starting adjuvant chemotherapy consisting of Taxotere and Cytoxan  Interval history:patient is seen in followup today. She had an Oncotype DX test performed. Her breast cancer recurrence score is 22 putting her in the intermediate risk category. She and I discussed treatment with  adjuvant chemotherapy. We discussed treating her with Taxotere and Cytoxan every 21 days for a total of 4 cycles. We talked about the risks and benefits as well. On the right she feels well. Remainder of the template review of systems is negative she is healing well from her breast surgery  Past Medical History: Past Medical History  Diagnosis Date  . Cancer   . Hyperlipidemia   . Breast cancer 2014    Past Surgical History: Past Surgical History  Procedure Laterality Date  . Appendectomy    . Nasal septum surgery      Family History: Family History  Problem Relation Age of Onset  . Breast cancer Mother 38    bilateral cancer, second diagnosis at 58  . Stomach cancer Father 57  . Breast cancer Sister 61    bilateral breast cancer, second dx at 58  . Breast cancer Maternal Aunt     dx in her 9s  . Prostate cancer Maternal Grandfather     dx in his 44s-60s  . Lung cancer Sister 58    smoker  . Breast cancer Maternal Aunt     dx in her 12s  . Breast cancer Cousin     maternal cousin  . Breast cancer Cousin     maternal cousin  . Breast cancer Paternal Aunt 46  . Bone cancer Paternal Uncle     thinks the cancer started somewhere else  . Breast cancer Paternal Aunt     dx in her 7s  . Cancer Cousin     dx in her 57s, a "female" cancer    Social History History  Substance Use Topics  .  Smoking status: Former Smoker -- 1.00 packs/day for 30 years    Quit date: 07/18/2002  . Smokeless tobacco: Not on file  . Alcohol Use: No    Allergies: Allergies  Allergen Reactions  . Codeine Nausea Only    Current Medications: Current Outpatient Prescriptions  Medication Sig Dispense Refill  . Azelastine HCl 0.15 % SOLN Place into the nose.      . Cholecalciferol (VITAMIN D3) 1000 UNITS CAPS Take by mouth.      . Cyanocobalamin (RA VITAMIN B-12 TR) 1000 MCG TBCR Take by mouth.      . diclofenac (VOLTAREN) 75 MG EC tablet       . DULoxetine (CYMBALTA) 60 MG capsule        . lovastatin (MEVACOR) 20 MG tablet Take 20 mg by mouth at bedtime.      . magnesium oxide (MAG-OX) 400 MG tablet Take by mouth.      . montelukast (SINGULAIR) 10 MG tablet       . montelukast (SINGULAIR) 10 MG tablet Take by mouth.      Marland Kitchen omeprazole (PRILOSEC) 40 MG capsule Take by mouth.      . oxybutynin (DITROPAN) 5 MG tablet       . pramipexole (MIRAPEX) 1.5 MG tablet       . pramipexole (MIRAPEX) 1.5 MG tablet Take by mouth.       No current facility-administered medications for this visit.    OB/GYN History: menarche at 68, menoapause at 69, HRT x3 years, age at first live birth 56, G30P2  Fertility Discussion: n/a Prior History of Cancer: no  Health Maintenance:  Colonoscopy yes 4 years ago,  Bone Density none Last PAP smear 1 1/2 years  ECOG PERFORMANCE STATUS: 0 - Asymptomatic  Genetic Counseling/testing: yes performed and negative  REVIEW OF SYSTEMS:  A comprehensive review of systems was negative.  PHYSICAL EXAMINATION: Blood pressure 130/80, pulse 99, temperature 98.9 F (37.2 C), temperature source Oral, resp. rate 20, height 5\' 6"  (1.676 m), weight 177 lb (80.287 kg).  ZOX:WRUEA, healthy, no distress, well nourished and well developed SKIN: skin color, texture, turgor are normal HEAD: Normocephalic EYES: PERRLA, EOMI, Conjunctiva are pink and non-injected EARS: External ears normal OROPHARYNX:no exudate and no erythema  NECK: supple, no adenopathy LYMPH:  no palpable lymphadenopathy, no hepatosplenomegaly BREAST:left breast normal without mass, skin or nipple changes or axillary nodes, right mastectomy site is well-healed no evidence of local recurrence or infections LUNGS: clear to auscultation and percussion HEART: regular rate & rhythm ABDOMEN:abdomen soft, non-tender, normal bowel sounds and no masses or organomegaly BACK: Back symmetric, no curvature., No CVA tenderness EXTREMITIES:less then 2 second capillary refill, no edema, no clubbing, no  cyanosis  NEURO: alert & oriented x 3 with fluent speech, no focal motor/sensory deficits, gait normal, reflexes normal and symmetric     STUDIES/RESULTS: No results found.   LABS:    Chemistry      Component Value Date/Time   NA 142 11/20/2012 1509      Component Value Date/Time   CALCIUM 9.0 11/20/2012 1509      Lab Results  Component Value Date   WBC 6.3 11/20/2012   PATHOLOGY: A.  SENTINEL LYMPH NODE, RIGHT AXILLA, #1, BIOPSY:      ONE LYMPH NODE, NEGATIVE FOR MALIGNANCY (0/1).   B.  SENTINEL LYMPH NODE, RIGHT AXILLA, #2, BIOPSY:      ONE LYMPH NODE, NEGATIVE FOR MALIGNANCY (0/1).   C.  SENTINEL LYMPH NODE, RIGHT  AXILLA, #3, BIOPSY:      ONE LYMPH NODE, NEGATIVE FOR MALIGNANCY (0/1).   D.  BREAST, RIGHT, SIMPLE MASTECTOMY:      RESIDUAL INVASIVE ADENOCARCINOMA OF THE BREAST.        HISTOLOGIC TYPE: DUCTAL WITH PROMINENT LOBULAR FEATURES.        NOTTINGHAM COMBINED HISTOLOGIC GRADE: 2 OF 3.           TUBULE FORMATION SCORE: 3           NUCLEAR PLEOMORPHISM SCORE: 2           MITOTIC RATE SCORE: 1        GROSS TUMOR SIZE: 2.1 X 2 X 1.8 CM.        SIZE OF INVASIVE COMPONENT: 2 CM.        LOCATION OF THE TUMOR: ADJACENT TO PREVIOUS BIOPSY SITE.        LYMPHATIC/VASCULAR INVASION: ABSENT.        MULTIFOCAL TUMOR: ABSENT.        TREATMENT EFFECT: NO KNOWN PRE-SURGICAL THERAPY.      IN-SITU CARCINOMA: PRESENT.        TYPE OF IN-SITU CARCINOMA: SOLID.        NUCLEAR GRADE OF IN-SITU CARCINOMA: 2 OF 3.        NECROSIS: ABSENT.        DCIS EXTENDING OUTSIDE INVASIVE TUMOR MASS: ABSENT.        SIZE OF IN-SITU CARCINOMA: NOT APPLICABLE.      NIPPLE STATUS: FREE OF TUMOR.     SKIN STATUS: FREE OF TUMOR.     MUSCLE STATUS: NOT SAMPLED.     STATUS OF NON-NEOPLASTIC BREAST TISSUE:     - BIOPSY SITE CHANGES.     - FIBROCYSTIC CHANGES.     - COLUMNAR CELL CHANGES.     SURGICAL MARGIN STATUS: NEGATIVE.     CLOSEST MARGIN: POSTERIOR.     DISTANCE TO CLOSEST  MARGIN: 0.9 CM.      AXILLARY LYMPH NODE STATUS: NO LYMPH NODES IN THIS SPECIMEN.      BREAST CANCER BIOMARKER STUDIES:  NOT PERFORMED.   ASSESSMENT    57 year old female with  #1 T2 N0 invasive ductal carcinoma of the right breast, grade 2 ER positive PR positive HER-2/neu negative. Patient is status post right mastectomy with sentinel lymph node biopsy on 09/12/2012. Postoperatively patient is doing well. She was last seen by her surgeon on 11/05/2012. She was recommended to followup with [place]. She is now here to discuss treatment options.  #2 patient and I and her husband went over her pathology in detail today. We discussed the pathophysiology of breast cancer. We discussed treatment options. Since patient has had her surgery and her tumor is a T2 measuring at 2.0 cm I have recommended that we do Oncotype DX testing to determine her breast cancer recurrence score. I explained to her that if her score is in the low risk category then she would be a candidate for antiestrogen therapy alone. However if it is in the intermediate or high risk then I would recommend doing adjuvant chemotherapy followed by antiestrogen therapy. We discussed rationale for Oncotype DX testing. We also discussed rationale for chemotherapy and antiestrogen therapy. We discussed side effects risks and benefits.  #3 patient had some blood work copies on her went over this. She is noted to have elevation in her LFTs this is concerning. I have recommended repeating her blood work here. She will come back tomorrow for  this for further evaluation. If her LFTs remain elevated then my plan would be to do imaging studies such as an ultrasound for further evaluation of her liver.  #4 patient is on complementary medicine including bio identical hormones (estrogen and progesterone). I explained to her that she may need to come off of these due to the fact that her tumor is ER/PR positive and she may be feeding possible microscopic  disease. She is going to think about this.  #5 patient will not need radiation therapy since she has had a mastectomy and she is no negative.  #6 OncotypeTest showed a recurrence score of 22 getting her in the intermediate risk category. I have recommended Taxotere Cytoxan. X4 cycles. The patient wants to have this done in [place]   PLAN: #1 patient will be referred to [facility] in consultation with with one of the oncologists therapy.  #2 I can see her back on an as-needed basis      Discussion: Patient is being treated per NCCN breast cancer care guidelines appropriate for stage.II   Thank you so much for allowing me to participate in the care of Rebecca Hensley. I will continue to follow up the patient with you and assist in her care.  All questions were answered. The patient knows to call the clinic with any problems, questions or concerns. We can certainly see the patient much sooner if necessary.  I spent 30 minutes counseling the patient face to face. The total time spent in the appointment was 30 minutes.  Drue Second, MD Medical/Oncology [facility] [phone] (beeper) [phone] (Office)   01/04/2013, 2:43 AM

## 2013-01-08 ENCOUNTER — Telehealth: Payer: Self-pay | Admitting: Oncology

## 2013-01-08 NOTE — Telephone Encounter (Signed)
Faxed pt medical records to [facility]. She will call pt with appt.

## 2013-01-17 HISTORY — PX: MASTECTOMY: SHX3

## 2013-01-21 ENCOUNTER — Telehealth: Payer: Self-pay | Admitting: Oncology

## 2013-01-21 NOTE — Telephone Encounter (Signed)
Pt appt with Dr. Jacquiline Doe is 01/24/13@11 :102

## 2013-01-24 ENCOUNTER — Other Ambulatory Visit (HOSPITAL_COMMUNITY): Payer: Self-pay | Admitting: Internal Medicine

## 2013-01-24 DIAGNOSIS — C50919 Malignant neoplasm of unspecified site of unspecified female breast: Secondary | ICD-10-CM

## 2013-01-25 ENCOUNTER — Other Ambulatory Visit: Payer: Self-pay | Admitting: Radiology

## 2013-01-29 ENCOUNTER — Inpatient Hospital Stay (HOSPITAL_COMMUNITY): Admission: RE | Admit: 2013-01-29 | Payer: BC Managed Care – PPO | Source: Ambulatory Visit

## 2013-01-29 ENCOUNTER — Ambulatory Visit (HOSPITAL_COMMUNITY): Admission: RE | Admit: 2013-01-29 | Payer: BC Managed Care – PPO | Source: Ambulatory Visit

## 2013-02-27 ENCOUNTER — Other Ambulatory Visit: Payer: BC Managed Care – PPO

## 2013-02-27 ENCOUNTER — Ambulatory Visit: Payer: BC Managed Care – PPO | Admitting: Oncology

## 2015-07-15 DIAGNOSIS — K219 Gastro-esophageal reflux disease without esophagitis: Secondary | ICD-10-CM | POA: Insufficient documentation

## 2015-07-15 DIAGNOSIS — G2581 Restless legs syndrome: Secondary | ICD-10-CM | POA: Insufficient documentation

## 2015-09-08 ENCOUNTER — Ambulatory Visit (HOSPITAL_COMMUNITY): Payer: Self-pay | Admitting: Hematology & Oncology

## 2015-09-10 ENCOUNTER — Encounter (HOSPITAL_COMMUNITY): Payer: Self-pay | Admitting: Hematology & Oncology

## 2015-09-11 ENCOUNTER — Encounter (HOSPITAL_COMMUNITY): Payer: Self-pay | Admitting: Oncology

## 2015-09-11 ENCOUNTER — Encounter (HOSPITAL_COMMUNITY): Payer: BLUE CROSS/BLUE SHIELD | Attending: Oncology | Admitting: Oncology

## 2015-09-11 ENCOUNTER — Ambulatory Visit (HOSPITAL_COMMUNITY): Payer: Self-pay | Admitting: Oncology

## 2015-09-11 VITALS — BP 109/63 | HR 94 | Temp 98.3°F | Resp 20 | Ht 62.0 in | Wt 166.0 lb

## 2015-09-11 DIAGNOSIS — C50811 Malignant neoplasm of overlapping sites of right female breast: Secondary | ICD-10-CM

## 2015-09-11 DIAGNOSIS — C50911 Malignant neoplasm of unspecified site of right female breast: Secondary | ICD-10-CM

## 2015-09-11 DIAGNOSIS — R5382 Chronic fatigue, unspecified: Secondary | ICD-10-CM

## 2015-09-11 DIAGNOSIS — C7989 Secondary malignant neoplasm of other specified sites: Secondary | ICD-10-CM

## 2015-09-11 DIAGNOSIS — Z5111 Encounter for antineoplastic chemotherapy: Secondary | ICD-10-CM

## 2015-09-11 DIAGNOSIS — Z170 Estrogen receptor positive status [ER+]: Secondary | ICD-10-CM

## 2015-09-11 MED ORDER — CAPECITABINE 150 MG PO TABS: 150.0000 mg | ORAL_TABLET | Freq: Two times a day (BID) | ORAL | 1 refills | Status: DC

## 2015-09-11 MED ORDER — ALPRAZOLAM 0.5 MG PO TABS: 0.5000 mg | ORAL_TABLET | Freq: Three times a day (TID) | ORAL | 0 refills | Status: DC | PRN

## 2015-09-11 MED ORDER — CAPECITABINE 500 MG PO TABS: 2000.0000 mg | ORAL_TABLET | Freq: Two times a day (BID) | ORAL | 1 refills | Status: DC

## 2015-09-11 NOTE — Assessment & Plan Note (Addendum)
Recurrent right breast cancer.  - pT1c N0 (0/3) invasive ductal carcinoma of the RIGHT breast, ER+, PR+, HER2-, grade 2, diagnosed and treated in 2014. She underwent RIGHT mastectomy and SLND with Dr. Carvel Hensley at that time. She was seen by medical oncology closer to home at that time. Oncotype DX was 22. Chemotherapy was recommended but she only took 1 cycle of TC before declining further therapy due to side effects. She also declined endocrine therapy at that time. - routine follow up on 02/24/2015 she was noted to have a nodule of the RIGHT chest wall or axilla.  - Ultrasound at the OSH on 03/04/2015 reportedly showed an irregular 1.2 cm mass, hypoechoic centrally and hyperechoic peripherally. Mass was superficial and abutting the chest wall musculature. Several small axillary lymph nodes were also noted but did not appear to have suspicious morphology. Images have not been reviewed in-house.  OSH Bone scan 03/03/15 was negative for metastasis OSH PET scan -Negative OSH MRI Abd/pelvis- Negative Biopsy - 03/23/2015 pathology showed ER+, PR+, HER2- IDC completed 4 cycles of AC chemotherapy with local medical oncologist,  right partial mastectomy 07/22/15   Oncology history is developed.  Staging in [facility] problem list is completed.  She has seen Dr. Higinio Hensley at [facility] on 09/03/2015.  Her recommendations follow: We reviewed Ms. Rebecca Hensley's previous diagnosis and treatment with her in detail and reviewed staging scans ordered by her local oncologist. I discussed with her that I am happy to serve in a consultative mode for her. I advised that we will provide treatment recommendations to her local providers, however, we cannot prescribe or drive the treatment at another facility. We discussed the challenges of having care administered by two different medical oncologists. She was advised that she could return for treatment here if she chooses to do this.   I reviewed the multidisciplinary tumor board discussion  frm 04/13/15. The overall recommendation was for neoadjuvant chemotherapy with [facility] given her poor tolerance to TC chemotherapy in the adjuvant setting.  I have recommended an echo for cardiac evaluation.  Would recommmend chemo teaching and start of AC as soon as echo could be done.  We would plan 4 cycles of AC and then reimage to assess for response. If she could proceed to surgery at that time would consider this versus further reduction in tumor burden with taxol chemotherapy to complete AC-T.   She completed AC x 4 locally with Rebecca[facility] Hensley. She had right partial mastectomy 07/22/15 with [facility]  Today we reviewed in detail her surgical pathology, which noted residual breast cancer. We discussed the option of further chemotherapy given lack of pathologic CR to [facility]. We discussed the option of completing a more standard chemotherapy regimen by the addition of taxane therapy in the adjuvant setting. She is not inclined to do this. We also reviewed data regarding the addition of xeloda for non-path-CR patients, CREATE-X, NEJM, 2017. Jun 1;376(22):2147-2159. We discussed the option to proceed with radiation therapy combined with radiosensitizing agent Xeloda for better response. This would also be followed by endocrine therapy with Antihormone medication.   She states that she cannot return for f/u at [facility]. She is establishing care with another medical oncologist locally. We will forward recommendations to her local oncologists.  She will follow up with local medical oncologist to discuss plan.  She knows she also needs to follow up with radiaition oncology I would recommend adjuvant AI therapy for 5-10 years after completion of RT.   She has a follow-up appointment  with Dr. Carvel Hensley on 10/12/2015 at [facility].  We have reviewed the CREAT-X trial with the patient.  We have reviewed the risks, benefits, alternatives, and side effects of Xeloda treatment including, but not limited to, nausea/vomiting,  fatigue, stomatitis, diarrhea, palmar-plantar erythrodysesthesia, anaphylaxis, and death.  Rx is printed for Xeloda 2000 mg BID days 1-14 every 21 days (in accordance with the CREAT-X trial).  She will take Xeloda for 6-8 cycles.  AI therapy will be initiated following completion of XRT after bone density exam is completed.  She has been referred to chemotherapy teaching course.  Messages have been sent to [facility] and [place].  She is provided a refill on her Xanax as well.  She starts XRT next Wednesday.  Return in 2-3 weeks for follow-up.

## 2015-09-11 NOTE — Patient Instructions (Signed)
Rebecca Hensley at [facility] Discharge Instructions  RECOMMENDATIONS MADE BY THE CONSULTANT AND ANY TEST RESULTS WILL BE SENT TO YOUR REFERRING PHYSICIAN.  We will start a medication called Xeloda.  The dosing of this is based on your body surface area.  This medication prescription is provided to our insurance authorization specialist.  Once approved, we will have you return for chemotherapy teaching with our nurse navigator to review this medication. We will have you return regularly for labs. We will see you back for follow-up in 2-3 weeks.  Thank you for choosing [place] at [facility] to provide your oncology and hematology care.  To afford each patient quality time with our provider, please arrive at least 15 minutes before your scheduled appointment time.   Beginning January 23rd 2017 lab work for the [facility] will be done in the  Main lab at [facility] on 1st floor. If you have a lab appointment with the [facility] please come in thru the  Main Entrance and check in at the main information desk  You need to re-schedule your appointment should you arrive 10 or more minutes late.  We strive to give you quality time with our providers, and arriving late affects you and other patients whose appointments are after yours.  Also, if you no show three or more times for appointments you may be dismissed from the clinic at the providers discretion.     Again, thank you for choosing [facility].  Our hope is that these requests will decrease the amount of time that you wait before being seen by our physicians.       _____________________________________________________________  Should you have questions after your visit to [facility], please contact our office at [phone] between the hours of 8:30 a.m. and 4:30 p.m.  Voicemails left after 4:30 p.m. will not be returned until the following business day.  For  prescription refill requests, have your pharmacy contact our office.         Resources For Cancer Patients and their Caregivers ? American Cancer Society: Can assist with transportation, wigs, general needs, runs Look Good Feel Better.        445-114-9748 ? Cancer Care: Provides financial assistance, online support groups, medication/co-pay assistance.  1-800-813-HOPE 870 513 9216) ? [facility] Assists [place] cancer patients and their families through emotional , educational and financial support.  778-486-2796 ? [place] DSS Where to apply for food stamps, Medicaid and utility assistance. 530-203-0212 ? [facility]: Transportation to medical appointments. (647)716-9881 ? Social Security Administration: May apply for disability if have a Stage IV cancer. 518-058-9711 206-550-7381 ? [employer], Disability and Transit Services: Assists with nutrition, care and transit needs. [place] Support Programs: @10RELATIVEDAYS @ > Cancer Support Group  2nd Tuesday of the month 1pm-2pm, Journey Room  > Creative Journey  3rd Tuesday of the month 1130am-1pm, Journey Room  > Look Good Feel Better  1st Wednesday of the month 10am-12 noon, Journey Room (Call [place] to register [phone])

## 2015-09-14 NOTE — Progress Notes (Signed)
[facility] Hematology/Oncology Consultation   Name: Rebecca Hensley      MRN: [id number]   Date: 09/14/2015 Time:9:25 AM   REFERRING PHYSICIAN:  Everardo All, MD (Medical Oncology)  REASON FOR CONSULT:  Transfer of medical oncology care   DIAGNOSIS:  Locally recurrent right chest wall breast cancer.    Breast cancer, right breast ([place])   06/27/2012 Initial Diagnosis    Breast cancer, invasive ductal carcinoma with lobular features      09/12/2012 Definitive Surgery    Right mastectomy with sentinel node biopsy      09/12/2012 Pathology Results    2.1 x 2.0 cm primary tumor, grade 2, ER 90%, PR NEGATIVE, HER2 NEGATIVE, ONCOTYPE score 22      01/29/2013 - 02/19/2013 Chemotherapy    TC x 1 cycle       02/18/2013 Adverse Reaction    Patient decided to stop chemotherapy after 1 cycle due to poor tolerance with side effects.      03/11/2015 Relapse/Recurrence    Positive biopsy from right chest wall nodule.      03/25/2015 PET scan    New hypermetabolic soft tissue nodule right anterior chest wall. Artifact versus small right hepatic metastatic lesion. Consider MRI for further evaluation      04/29/2015 - 06/10/2015 Chemotherapy    Dose-dense Adriamycin/Cytoxan x 4 cycles at [facility].  Last cycle requiring 25% dose reduction due to toxicities.      07/15/2015 Breast MRI    MRI breast- Slight interval decrease in size of biopsy proven recurrence in the right chest wall.No MRI evidence of malignancy in the left breast.      07/22/2015 Procedure    Right partial mastectomy by Dr. Carvel Getting ([place])      07/22/2015 Pathology Results    Right chest wall recurrent invasive ductal carcinoma with lobular features, intermediate grade ([id number]). ER+, PR +, HER2 NEGATIVE.      09/23/2015 Concurrent Chemotherapy    XRT with Xeloda 1250 mg/m2 days 1-14 every 21 days per [facility]: CREAT-X, NEJM 2017. Jun 1;376(22):2147-2159.   XRT beginning on 9/6       HISTORY OF PRESENT  ILLNESS:   Rebecca Hensley is a 60 y.o. female with a medical history significant for breast cancer who is referred to the [facility] for establishment and ongoing oncology care and treatment of locally recurrent invasive ductal carcinoma with lobular features of right breast with recurrence to chest wall. She is followed at [facility], she was treated locally by Dr. Tressie Stalker, last seen on 06/17/2015.   She notes fatigue and tiredness complicated with past treatment and work.  She reports new blurry vision in both eyes and notes that she has an upcoming opthalmology appointment.  She denies any new medications and is not on any steroids.  As noted, she has been seen at [facility] and we are following their recommendations regarding ongoing treatment options. She underwent 4 cycles of AC, with dose reduction on cycle #2. Resection of the chest wall lesion and axillary LN dissection level 1,2 was performed at [place] on 07/22/2015.          Review of Systems  Constitutional: Negative for chills, fever and weight loss.  HENT: Negative.  Negative for congestion and nosebleeds.   Eyes: Positive for blurred vision (nearsighted). Negative for double vision, discharge and redness.  Respiratory: Negative.  Negative for cough and hemoptysis.   Cardiovascular: Negative.  Negative for chest  pain.  Gastrointestinal: Negative for abdominal pain, blood in stool, constipation, diarrhea, heartburn, nausea and vomiting.  Genitourinary: Negative.   Musculoskeletal: Positive for joint pain. Negative for back pain, myalgias and neck pain.  Skin: Negative.   Neurological: Negative.  Negative for dizziness, sensory change, speech change, focal weakness, seizures, loss of consciousness, weakness and headaches.  Endo/Heme/Allergies: Negative.   Psychiatric/Behavioral: The patient is nervous/anxious.   14 point review of systems was performed and is negative except as detailed under history of present  illness and above    PAST MEDICAL HISTORY:   Past Medical History:  Diagnosis Date  . Breast cancer ([place]) 2014  . Cancer ([place])   . Fuchs' corneal dystrophy   . Hyperlipidemia     ALLERGIES: Allergies  Allergen Reactions  . Codeine Nausea Only  . Gabapentin Other (See Comments)      MEDICATIONS: I have reviewed the patient's current medications.    Current Outpatient Prescriptions on File Prior to Visit  Medication Sig Dispense Refill  . Cholecalciferol (VITAMIN D3) 1000 UNITS CAPS Take by mouth.    . Cyanocobalamin (RA VITAMIN B-12 TR) 1000 MCG TBCR Take by mouth.    . montelukast (SINGULAIR) 10 MG tablet     . omeprazole (PRILOSEC) 40 MG capsule Take by mouth.     No current facility-administered medications on file prior to visit.      PAST SURGICAL HISTORY Past Surgical History:  Procedure Laterality Date  . APPENDECTOMY    . ENDOSCOPIC INSERTION PERITONEAL CATHETER PORT    . EXCISION MASS NECK    . NASAL SEPTUM SURGERY      FAMILY HISTORY: Family History  Problem Relation Age of Onset  . Breast cancer Mother 71    bilateral cancer, second diagnosis at 63  . Stomach cancer Father 65  . Breast cancer Sister 28    bilateral breast cancer, second dx at 75  . Breast cancer Maternal Aunt     dx in her 70s  . Prostate cancer Maternal Grandfather     dx in his 62s-60s  . Lung cancer Sister 39    smoker  . Breast cancer Maternal Aunt     dx in her 15s  . Breast cancer Cousin     maternal cousin  . Breast cancer Cousin     maternal cousin  . Breast cancer Paternal Aunt 60  . Bone cancer Paternal Uncle     thinks the cancer started somewhere else  . Breast cancer Paternal Aunt     dx in her 28s  . Cancer Cousin     dx in her 68s, a "female" cancer    SOCIAL HISTORY:  reports that she quit smoking about 13 years ago. She has a 30.00 pack-year smoking history. She has never used smokeless tobacco. She reports that she does not drink alcohol or use  drugs.  Social History   Social History  . Marital status: Married    Spouse name: N/A  . Number of children: 2  . Years of education: N/A   Occupational History  .  [employer]   Social History Main Topics  . Smoking status: Former Smoker    Packs/day: 1.00    Years: 30.00    Quit date: 07/18/2002  . Smokeless tobacco: Never Used  . Alcohol use No  . Drug use: No  . Sexual activity: Not Asked   Other Topics Concern  . None   Social History Narrative  . None  PERFORMANCE STATUS: The patient's performance status is 1 - Symptomatic but completely ambulatory  PHYSICAL EXAM: Most Recent Vital Signs: Blood pressure 109/63, pulse 94, temperature 98.3 F (36.8 C), temperature source Oral, resp. rate 20, height '5\' 2"'$  (1.575 m), weight 166 lb (75.3 kg), SpO2 96 %. General appearance: alert, cooperative, appears stated age, fatigued, no distress and unaccompanied Head: Normocephalic, without obvious abnormality, atraumatic Eyes: negative findings: lids and lashes normal, conjunctivae and sclerae normal and corneas clear Throat: lips, mucosa, and tongue normal; teeth and gums normal Neck: supple, symmetrical, trachea midline and thyroid not enlarged, symmetric, no tenderness/mass/nodules Lungs: clear to auscultation bilaterally and normal percussion bilaterally Heart: regular rate and rhythm, S1, S2 normal, no murmur, click, rub or gallop Abdomen: soft, non-tender; bowel sounds normal; no masses,  no organomegaly Extremities: extremities normal, atraumatic, no cyanosis or edema Skin: Skin color, texture, turgor normal. No rashes or lesions Lymph nodes: Cervical, supraclavicular, and axillary nodes normal. Neurologic: Grossly normal  LABORATORY DATA:  No results found for this or any previous visit (from the past 48 hour(s)).        RADIOGRAPHY: No results found.    [id number] - MRI BREAST BILATERAL WITH AND WITHOUT CONTRAST BREAST MRI OF BOTH BREASTS :  07/15/2015 CLINICAL: C50.411 Malignant Neoplasm Of Upper-Outer Quadrant Of Right  Female Breast ([facility]).  Comparison is made to exam dated:03/31/2015 breast MRI - [facility].  Indication:60 year old female post right mastectomy with recurrent  malignancy in the right mastectomy bed undergoing preoperative  chemotherapy.  Technique: A dedicated breast coil was used. Axial T1 and FSEIR images were obtained  of both breasts. Axial T1 weighted 3D images with fat suppression were  obtained of both breasts both before and after the IV administration of 15  ml of Multihance injected at a rate of 2 ml per second.Serum creatinine  was 0.5 mg/dl on 06/17/15.  This study was acquired both before and after the IV administration of  gadolinium contrast material. Given the patient's indications for the  examination, IV contrast was administered to improve disease detection and  further define anatomy. CAD (computer aided detection) was performed to  potentially increase study sensitivity and specificity.   Findings: There is mild background parenchymal enhancement.  Right breast: The patient is status post right mastectomy.Previously identified 14 x 14  x 14 mm biopsy proven recurrence in the right chest wall has decreased in  size and volume and now measures 12 x 12 x 14 mm. No abnormally enlarged  right axillary lymph nodes.  Left breast: No suspicious enhancing masses or suspicious areas of enhancement to  suggest malignancy.No abnormally enlarged left axillary lymph nodes. IMPRESSION: KNOWN BIOPSY PROVEN MALIGNANCY   1.Slight interval decrease in size of biopsy proven recurrence in the  right chest wall. 2.No MRI evidence of malignancy in the left breast.  Theodora Blow M.D. sy/:07/15/2015 12:21:03  Imaging Technologist: Candise Bowens, [facility]  MRI BI-RADS: 6 Known biopsy proven malignancy   Result Impression   IMPRESSION: 1. New hypermetabolic soft tissue nodule right anterior chest wall 2. Artifact versus small right hepatic metastatic lesion. Consider MRI for further evaluation  Result Narrative  INDICATION: C50.911: Malignant neoplasm of unspecified site of right female breast  TECHNIQUE: 8.1 millicuries of Z-66 FDG was administered intravenously. PET imaging was obtained from the skull base to the mid thighs. CT images were obtained for attenuation correction and localization purposes. Glucose level was 98 MG/DL. Time from  injection to scan was  66 minutes. Comparison 07/23/2013.  FINDINGS: Right mastectomy. Clips in the right axilla. There is a new soft tissue nodule in the right anterior lateral chest wall subcutaneous tissues which measures 11 x 5 mm with an SUV Max of 2.8.  Small hypermetabolic focus over the right lobe of the liver with SUV Max of 4.4. Background liver activity is 3.4. No obvious corresponding CT abnormality.  Stable 4 mm right lung nodule image 94. Stable small right upper lobe nodule image 86. Stable mild hypermetabolic lytic lesion left iliac wing.      PATHOLOGY:    A.Chest wall, right (excision):  Intermediate-grade carcinoma involving prior surgical site, consistent with clinically recurrent invasive ductal carcinoma with lobular features, is identified infiltrating an area measuring 19 mm; skin is present and not involved by carcinoma.  Surgical resection margins are negative for invasive carcinoma, with closest superior (by 0.5 mm) and inferior (by 2.5 mm) margins.   Breast cancer biomarker studies:Pending.  Paraffin block number:A7.  Results will be issued in a separate report from the [facility].   B.Lymph nodes, right axillary (excision):  Multiple lymph nodes are negative for metastatic carcinoma (0 of 5).    Right chest wall containing intermediate-grade carcinoma, consistent with clinically recurrent invasive  ductal carcinoma with lobular features([id number], Block A7; Cold Ischemia Time:2 hours, 41 minutes, Formalin Fixation Time:20 hours).   INTERPRETATION  ER INTERPRETATION: POSITIVE ESTROGEN RECEPTOR ACTIVITY (ALLRED SCORE = 8).  PR INTERPRETATION: POSITIVE PROGESTERONE RECEPTOR ACTIVITY (ALLRED SCORE = 4).   HER2/neu IMMUNOHISTOCHEMISTRY INTERPRETATION:NEGATIVE (0) FOR OVEREXPRESSION OF HER2/NEU ONCOPROTEIN.      ASSESSMENT/PLAN:  Breast cancer, right breast 2014 T1c, N0 R Mastectomy Oncotype DX score 22 TC X 1, declined endocrine therapy Right chest wall recurrence, axillary tail ER+ PR+ HER 2 - AC X 4 Resection with LN dissection at [facility] Fatigue  Recurrent right breast cancer.  - pT1c N0 (0/3) invasive ductal carcinoma of the RIGHT breast, ER+, PR+, HER2-, grade 2, diagnosed and treated in 2014. She underwent RIGHT mastectomy and SLND with Dr. Carvel Getting at that time. She was seen by medical oncology closer to home at that time. Oncotype DX was 22. Chemotherapy was recommended but she only took 1 cycle of TC before declining further therapy due to side effects. She also declined endocrine therapy at that time. - routine follow up on 02/24/2015 she was noted to have a nodule of the RIGHT chest wall or axilla.  - Ultrasound at the OSH on 03/04/2015 reportedly showed an irregular 1.2 cm mass, hypoechoic centrally and hyperechoic peripherally. Mass was superficial and abutting the chest wall musculature. Several small axillary lymph nodes were also noted but did not appear to have suspicious morphology. Images have not been reviewed in-house.  OSH Bone scan 03/03/15 was negative for metastasis OSH PET scan -Negative OSH MRI Abd/pelvis- Negative Biopsy - 03/23/2015 pathology showed ER+, PR+, HER2- ID[facility] completed 4 cycles of AC chemotherapy with local medical oncologist,  right partial mastectomy with LN dissection 07/22/15   Oncology history is developed.  Staging in [facility] problem list is  completed.  She has seen Dr. Higinio Roger at [facility] on 09/03/2015.  Her recommendations follow: We reviewed Ms. Golaszewski's previous diagnosis and treatment with her in detail and reviewed staging scans ordered by her local oncologist. I discussed with her that I am happy to serve in a consultative mode for her. I advised that we will provide treatment recommendations to her local providers, however, we cannot prescribe or drive the  treatment at another facility. We discussed the challenges of having care administered by two different medical oncologists. She was advised that she could return for treatment here if she chooses to do this.   I reviewed the multidisciplinary tumor board discussion frm 04/13/15. The overall recommendation was for neoadjuvant chemotherapy with [facility] given her poor tolerance to TC chemotherapy in the adjuvant setting.  I have recommended an echo for cardiac evaluation.  Would recommmend chemo teaching and start of AC as soon as echo could be done.  We would plan 4 cycles of AC and then reimage to assess for response. If she could proceed to surgery at that time would consider this versus further reduction in tumor burden with taxol chemotherapy to complete AC-T.   She completed AC x 4 locally with Dr.Eric Neijstrom. She had right partial mastectomy 07/22/15 with Dr.Greenup  Today we reviewed in detail her surgical pathology, which noted residual breast cancer. We discussed the option of further chemotherapy given lack of pathologic CR to [facility]. We discussed the option of completing a more standard chemotherapy regimen by the addition of taxane therapy in the adjuvant setting. She is not inclined to do this. We also reviewed data regarding the addition of xeloda for non-path-CR patients, CREATE-X, NEJM, 2017. Jun 1;376(22):2147-2159. We discussed the option to proceed with radiation therapy combined with radiosensitizing agent Xeloda for better response. This would also be followed by  endocrine therapy with Antihormone medication.   She states that she cannot return for f/u at [facility]. She is establishing care with another medical oncologist locally. We will forward recommendations to her local oncologists.  She will follow up with local medical oncologist to discuss plan.  She knows she also needs to follow up with radiaition oncology I would recommend adjuvant AI therapy for 5-10 years after completion of RT.   She has a follow-up appointment with Dr. Carvel Getting on 10/12/2015 at [facility].  We have reviewed the CREAT-X trial with the patient.  We have reviewed the risks, benefits, alternatives, and side effects of Xeloda treatment including, but not limited to, nausea/vomiting, fatigue, stomatitis, diarrhea, palmar-plantar erythrodysesthesia, anaphylaxis, and death.  Rx is printed for Xeloda 2000 mg BID days 1-14 every 21 days (in accordance with the CREAT-X trial).  She will take Xeloda for 6-8 cycles.  AI therapy will be initiated following completion of XRT after bone density exam is completed.  She has been referred to chemotherapy teaching course.  Messages have been sent to [facility] and [place].  She is provided a refill on her Xanax as well.  She starts XRT next Wednesday.  Return in 2-3 weeks for follow-up.   ORDERS PLACED FOR THIS ENCOUNTER: Orders Placed This Encounter  Procedures  . CBC with Differential  . Comprehensive metabolic panel    All questions were answered. The patient knows to call the clinic with any problems, questions or concerns. We can certainly see the patient much sooner if necessary.  This note is electronically signed by.Molli Hazard, MD  :09/14/2015 9:25 AM

## 2015-09-16 ENCOUNTER — Other Ambulatory Visit (HOSPITAL_COMMUNITY): Payer: Self-pay | Admitting: Oncology

## 2015-09-16 ENCOUNTER — Encounter (HOSPITAL_COMMUNITY): Payer: BLUE CROSS/BLUE SHIELD

## 2015-09-16 DIAGNOSIS — C50911 Malignant neoplasm of unspecified site of right female breast: Secondary | ICD-10-CM

## 2015-09-16 MED ORDER — ONDANSETRON HCL 8 MG PO TABS: 8.0000 mg | ORAL_TABLET | Freq: Three times a day (TID) | ORAL | 2 refills | Status: DC | PRN

## 2015-09-16 MED ORDER — PROCHLORPERAZINE MALEATE 10 MG PO TABS: 10.0000 mg | ORAL_TABLET | Freq: Four times a day (QID) | ORAL | 2 refills | Status: DC | PRN

## 2015-09-16 NOTE — Patient Instructions (Signed)
[place]   CHEMOTHERAPY INSTRUCTIONS   Xeloda - diarrhea, hand-foot syndrome (hands/feet can get red/tender/and skin can peel). Avoid friction and hot environments on hands/feet. Wear cotton socks. Lotion twice a day to hands/fingers/feet/toes with Udder cream. Mucositis (inflammation of any mucosal membrane can develop -this can occur in the throat/mouth). Mouth sores, nausea/vomiting, anemia, fatigue can also develop. Take Imodium if diarrhea develops and contact us immediately - we will give you further instructions on how to take your Imodium. Xeloda usually comes with a teaching packet from the mail order/specialty pharmacy that will supply you with the drug that is really informative about diarrhea and hand-foot syndrome. No pregnant, child bearing age people, or animals should come into contact with this drug. Even though this is in pill form - it is still very powerful!!! If you should be instructed to discontinue taking this drug, bring the drug into the [facility] and we will dispose of it in a safe manner. Chemotherapy is a biohazard and must be disposed of properly. Do not touch this pill much. It would be best if the caregiver wore gloves while handling. Do not put this pill in with the rest of your pills in a pill box. Keep them in a separate pill box. If you are taking Coumadin while taking this drug, we will need to monitor your PT/INR (lab) closely because Xeloda can increase the effectiveness of Coumadin. You should take this medication within 30 minutes after eating your am and pm meals.     SELF IMAGE NEEDS AND REFERRALS MADE: Information on look good feel better   EDUCATIONAL MATERIALS GIVEN AND REVIEWED: Chemotherapy and you book, information on Xeloda given   SELF CARE ACTIVITIES WHILE ON CHEMOTHERAPY:  Increase your fluid intake 48 hours prior to treatment and drink at least 2 quarts (64 oz of water/decaff beverages) per day after treatment. No alcohol  intake. No aspirin or other medications unless approved by your oncologist. Eat foods that are light and easy to digest. Eat foods at cold or room temperature (as long as you aren't on the drug Oxaliplatin). No fried, fatty, or spicy foods immediately before or after treatment. Have teeth cleaned professionally before starting treatment. Keep dentures and partial plates clean. Use soft toothbrush and do not use mouthwashes that contain alcohol. Biotene is a good mouthwash that is available at most pharmacies or may be ordered by calling [phone]. Use warm salt water gargles (1 teaspoon salt per 1 quart warm water) before and after meals and at bedtime. Or you may rinse with 2 tablespoons of three-percent hydrogen peroxide mixed in eight ounces of water. Always use sunscreen that has not expired and with SPF (Sun Protection Factor) of 50 or higher. Wear hats to protect your head from the sun. Remember to use sunscreen on your hands, ears, face, & feet. Use your nausea medication as directed to prevent nausea. Use your stool softener or laxative as directed to prevent constipation. Use your anti-diarrheal medication as directed to stop diarrhea.   Please wash your hands for at least 30 seconds using warm soapy water. Handwashing is the #1 way to prevent the spread of germs. Stay away from sick people or people who are getting over a cold. If you develop respiratory systems such as green/yellow mucus production or productive cough or persistent cough let us know and we will see if you need an antibiotic. It is a good idea to keep a pair of gloves on when going  into grocery stores/[employer] to decrease your risk of coming into contact with germs on the carts, etc. Carry alcohol hand gel with you at all times and use it frequently if out in public. All foods need to be cooked thoroughly. No raw foods. No medium or undercooked meats, eggs. If your food is cooked medium well, it does not need to be hot pink or  saturated with bloody liquid at all. Vegetables and fruits need to be washed/rinsed under the faucet with a dish detergent before being consumed. You can eat raw fruits and vegetables unless we tell you otherwise but it would be best if you cooked them or bought frozen. Do not eat off of salad bars or hot bars unless you really trust the cleanliness of the restaurant. If you need dental work, please let Dr. Whitney Muse know before you go for your appointment so that we can coordinate the best possible time for you in regards to your chemo regimen. You need to also let your dentist know that you are actively taking chemo. We may need to do labs prior to your dental appointment. We also want your bowels moving at least every other day. If this is not happening, we need to know so that we can get you on a bowel regimen to help you go. If you are going to have sex, a condom must be used to protect the person that isn't taking chemotherapy. Chemo can decrease your libido (sex drive).     MEDICATIONS:                                                                                                                                                               Zofran/Ondansetron 8mg  tablet. Take 1 tablet every 8 hours as needed for nausea/vomiting. (#1 nausea med to take, this can constipate)  Compazine/Prochlorperazine 10mg  tablet. Take 1 tablet every 6 hours as needed for nausea/vomiting. (#2 nausea med to take, this can make you sleepy)   Over-the-Counter Meds:  Miralax 1 capful in 8 oz of fluid daily. May increase to two times a day if needed. This is a stool softener. If this doesn't work proceed you can add:  Senokot S  - start with 1 tablet two times a day and increase to 4 tablets two times a day if needed. (total of 8 tablets in a 24 hour period). This is a stimulant laxative.   Call us if this does not help your bowels move.   Imodium 2mg  capsule. Take 2 capsules after the 1st loose stool and then 1  capsule every 2 hours until you go a total of 12 hours without having a loose stool. Call the [facility] if      (Please refer to/review other teaching materials that have been provided  to you in this blue folder - What to Know During Chemo, What to know After Chemo, Dr. Donald Pore Advice, Constipation Sheet, Diarrhea Sheet, Nausea Sheet, Self Care Activities While on Chemo)    SYMPTOMS TO REPORT AS SOON AS POSSIBLE AFTER TREATMENT:  FEVER GREATER THAN 100.5 F  CHILLS WITH OR WITHOUT FEVER  NAUSEA AND VOMITING THAT IS NOT CONTROLLED WITH YOUR NAUSEA MEDICATION  UNUSUAL SHORTNESS OF BREATH  UNUSUAL BRUISING OR BLEEDING  TENDERNESS IN MOUTH AND THROAT WITH OR WITHOUT PRESENCE OF ULCERS  URINARY PROBLEMS  BOWEL PROBLEMS  UNUSUAL RASH    Wear comfortable clothing and clothing appropriate for easy access to any Portacath or PICC line. Let us know if there is anything that we can do to make your therapy better!      I have been informed and understand all of the instructions given to me and have received a copy. I have been instructed to call the clinic [phone])  or my family physician as soon as possible for continued medical care, if indicated. I do not have any more questions at this time but understand that I may call the [facility] at ([phone]) during office hours should I have questions or need assistance in obtaining follow-up care.           Capecitabine tablets What is this medicine? CAPECITABINE (ka pe SITE a been) is a chemotherapy drug. It slows the growth of cancer cells. This medicine is used to treat breast cancer, and also colon or rectal cancer. This medicine may be used for other purposes; ask your health care provider or pharmacist if you have questions. What should I tell my health care provider before I take this medicine? They need to know if you have any of these conditions: -bleeding or blood disorders -dihydropyrimidine dehydrogenase (DPD)  deficiency -heart disease -infection (especially a virus infection such as chickenpox, cold sores, or herpes) -kidney disease -liver disease -an unusual or allergic reaction to capecitabine, 5-fluorouracil, other medicines, foods, dyes, or preservatives -pregnant or trying to get pregnant -breast-feeding How should I use this medicine? Take this medicine by mouth with a glass of water, within 30 minutes of the end of a meal. Do not cut, crush or chew this medicine. Follow the directions on the prescription label. Take your medicine at regular intervals. Do not take it more often than directed. Do not stop taking except on your doctor's advice. Your doctor may want you to take a combination of 150 mg and 500 mg tablets for each dose. It is very important that you know how to correctly take your dose. Taking the wrong tablets could result in an overdose (too much medication) or underdose (too little medication). Talk to your pediatrician regarding the use of this medicine in children. Special care may be needed. Overdosage: If you think you have taken too much of this medicine contact a poison control center or emergency room at once. NOTE: This medicine is only for you. Do not share this medicine with others. What if I miss a dose? If you miss a dose, do not take the missed dose at all. Do not take double or extra doses. Instead, continue with your next scheduled dose and check with your doctor. What may interact with this medicine? -antacids with aluminum and/or magnesium -folic acid -leucovorin -medicines to increase blood counts like filgrastim, pegfilgrastim, sargramostim -phenytoin -vaccines -warfarin Talk to your doctor or health care professional before taking any of these medicines: -acetaminophen -aspirin -ibuprofen -ketoprofen -  naproxen This list may not describe all possible interactions. Give your health care provider a list of all the medicines, herbs, non-prescription  drugs, or dietary supplements you use. Also tell them if you smoke, drink alcohol, or use illegal drugs. Some items may interact with your medicine. What should I watch for while using this medicine? Visit your doctor for checks on your progress. This drug may make you feel generally unwell. This is not uncommon, as chemotherapy can affect healthy cells as well as cancer cells. Report any side effects. Continue your course of treatment even though you feel ill unless your doctor tells you to stop. In some cases, you may be given additional medicines to help with side effects. Follow all directions for their use. Call your doctor or health care professional for advice if you get a fever, chills or sore throat, or other symptoms of a cold or flu. Do not treat yourself. This drug decreases your body's ability to fight infections. Try to avoid being around people who are sick. This medicine may increase your risk to bruise or bleed. Call your doctor or health care professional if you notice any unusual bleeding. Be careful brushing and flossing your teeth or using a toothpick because you may get an infection or bleed more easily. If you have any dental work done, tell your dentist you are receiving this medicine. Avoid taking products that contain aspirin, acetaminophen, ibuprofen, naproxen, or ketoprofen unless instructed by your doctor. These medicines may hide a fever. Do not become pregnant while taking this medicine. Women should inform their doctor if they wish to become pregnant or think they might be pregnant. There is a potential for serious side effects to an unborn child. Talk to your health care professional or pharmacist for more information. Do not breast-feed an infant while taking this medicine. Men are advised not to father a child while taking this medicine. What side effects may I notice from receiving this medicine? Side effects that you should report to your doctor or health care  professional as soon as possible: -allergic reactions like skin rash, itching or hives, swelling of the face, lips, or tongue -low blood counts - this medicine may decrease the number of white blood cells, red blood cells and platelets. You may be at increased risk for infections and bleeding. -signs of infection - fever or chills, cough, sore throat, pain or difficulty passing urine -signs of decreased platelets or bleeding - bruising, pinpoint red spots on the skin, black, tarry stools, blood in the urine -signs of decreased red blood cells - unusually weak or tired, fainting spells, lightheadedness -breathing problems -changes in vision -chest pain -dark urine -diarrhea of more than 4 bowel movements in one day or any diarrhea at night; bloody or watery diarrhea -dizziness -mouth sores -nausea and vomiting -pain, tingling, numbness in the hands or feet -redness, swelling, or sores on hands or feet -stomach pain -vomiting -yellow color of skin or eyes Side effects that usually do not require medical attention (report to your doctor or health care professional if they continue or are bothersome): -constipation -diarrhea -dry or itchy skin -hair loss -loss of appetite -nausea -weak or tired This list may not describe all possible side effects. Call your doctor for medical advice about side effects. You may report side effects to FDA at 1-800-FDA-1088. Where should I keep my medicine? Keep out of the reach of children. Store at room temperature between 15 and 30 degrees C (59 and 86 degrees  F). Keep container tightly closed. Throw away any unused medicine after the expiration date. NOTE: This sheet is a summary. It may not cover all possible information. If you have questions about this medicine, talk to your doctor, pharmacist, or health care provider.    2016, [employer]. (2014-02-17 17:03:30)

## 2015-09-17 ENCOUNTER — Other Ambulatory Visit (HOSPITAL_COMMUNITY): Payer: Self-pay | Admitting: Emergency Medicine

## 2015-09-22 ENCOUNTER — Encounter: Payer: Self-pay | Admitting: *Deleted

## 2015-09-22 NOTE — Progress Notes (Signed)
[place] Psychosocial Distress Screening Clinical Social Work  Clinical Social Work was referred by distress screening protocol.  The patient scored a 7 on the Psychosocial Distress Thermometer which indicates severe distress. Clinical Social Worker phoned pt at home to assess for distress and other psychosocial needs. CSW left supportive message explaining role of CSW and Support Programs available for additional support as needed. CSW provided pt with CSW availability and contact information.   [facility] DISTRESS SCREENING 09/16/2015  Screening Type Initial Screening  Distress experienced in past week (1-10) 7  Emotional problem type Depression;Nervousness/Anxiety;Adjusting to illness;Adjusting to appearance changes  Spiritual/Religous concerns type Relating to God  Information Concerns Type Lack of info about diagnosis;Lack of info about treatment;Lack of info about complementary therapy choices;Lack of info about maintaining fitness  Physical Problem type Sleep/insomnia;Getting around;Skin dry/itchy  Other sleep    Clinical Social Worker follow up needed: Yes.    If yes, follow up plan: CSW awaits return call.  Loren Racer, [place] [employer]ues[employer]   [phone]

## 2015-09-28 ENCOUNTER — Encounter (HOSPITAL_BASED_OUTPATIENT_CLINIC_OR_DEPARTMENT_OTHER): Payer: BLUE CROSS/BLUE SHIELD

## 2015-09-28 ENCOUNTER — Encounter (HOSPITAL_COMMUNITY): Payer: Self-pay | Admitting: Oncology

## 2015-09-28 ENCOUNTER — Other Ambulatory Visit (HOSPITAL_COMMUNITY): Payer: BLUE CROSS/BLUE SHIELD

## 2015-09-28 ENCOUNTER — Encounter (HOSPITAL_COMMUNITY): Payer: BLUE CROSS/BLUE SHIELD | Attending: Oncology | Admitting: Oncology

## 2015-09-28 DIAGNOSIS — Z95828 Presence of other vascular implants and grafts: Secondary | ICD-10-CM

## 2015-09-28 DIAGNOSIS — C7989 Secondary malignant neoplasm of other specified sites: Secondary | ICD-10-CM

## 2015-09-28 DIAGNOSIS — Z170 Estrogen receptor positive status [ER+]: Secondary | ICD-10-CM | POA: Diagnosis not present

## 2015-09-28 DIAGNOSIS — C50911 Malignant neoplasm of unspecified site of right female breast: Secondary | ICD-10-CM | POA: Diagnosis present

## 2015-09-28 DIAGNOSIS — Z452 Encounter for adjustment and management of vascular access device: Secondary | ICD-10-CM | POA: Diagnosis not present

## 2015-09-28 LAB — COMPREHENSIVE METABOLIC PANEL
ALT: 30 U/L (ref 14–54)
ANION GAP: 8 (ref 5–15)
AST: 23 U/L (ref 15–41)
Albumin: 4.2 g/dL (ref 3.5–5.0)
Alkaline Phosphatase: 69 U/L (ref 38–126)
BILIRUBIN TOTAL: 0.4 mg/dL (ref 0.3–1.2)
BUN: 14 mg/dL (ref 6–20)
CO2: 27 mmol/L (ref 22–32)
Calcium: 9.2 mg/dL (ref 8.9–10.3)
Chloride: 103 mmol/L (ref 101–111)
Creatinine, Ser: 0.64 mg/dL (ref 0.44–1.00)
GFR calc Af Amer: >60 mL/min (ref >60)
Glucose, Bld: 114 mg/dL — ABNORMAL HIGH (ref 65–99)
POTASSIUM: 3.7 mmol/L (ref 3.5–5.1)
Sodium: 138 mmol/L (ref 135–145)
TOTAL PROTEIN: 7.1 g/dL (ref 6.5–8.1)

## 2015-09-28 LAB — CBC WITH DIFFERENTIAL/PLATELET
BASOS ABS: 0 10*3/uL (ref 0.0–0.1)
BASOS PCT: 0 %
EOS PCT: 0 %
Eosinophils Absolute: 0 10*3/uL (ref 0.0–0.7)
HEMATOCRIT: 41.4 % (ref 36.0–46.0)
Hemoglobin: 13.9 g/dL (ref 12.0–15.0)
LYMPHS PCT: 20 %
Lymphs Abs: 0.7 10*3/uL (ref 0.7–4.0)
MCH: 28.1 pg (ref 26.0–34.0)
MCHC: 33.6 g/dL (ref 30.0–36.0)
MCV: 83.8 fL (ref 78.0–100.0)
MONO ABS: 0.4 10*3/uL (ref 0.1–1.0)
MONOS PCT: 12 %
NEUTROS ABS: 2.2 10*3/uL (ref 1.7–7.7)
Neutrophils Relative %: 68 %
PLATELETS: 170 10*3/uL (ref 150–400)
RBC: 4.94 MIL/uL (ref 3.87–5.11)
RDW: 12.6 % (ref 11.5–15.5)
WBC: 3.3 10*3/uL — ABNORMAL LOW (ref 4.0–10.5)

## 2015-09-28 MED ORDER — HEPARIN SOD (PORK) LOCK FLUSH 100 UNIT/ML IV SOLN
INTRAVENOUS | Status: AC
  Filled 2015-09-28: qty 5

## 2015-09-28 MED ORDER — SODIUM CHLORIDE 0.9% FLUSH
10.0000 mL | INTRAVENOUS | Status: DC | PRN
  Administered 2015-09-28: 10 mL via INTRAVENOUS
  Filled 2015-09-28: qty 10

## 2015-09-28 MED ORDER — HEPARIN SOD (PORK) LOCK FLUSH 100 UNIT/ML IV SOLN
500.0000 [IU] | Freq: Once | INTRAVENOUS | Status: AC
Start: 2015-09-28 — End: 2015-09-28
  Administered 2015-09-28: 500 [IU] via INTRAVENOUS

## 2015-09-28 NOTE — Progress Notes (Signed)
Rebecca Ponto, MD [place] [address]  Malignant neoplasm of right female breast, unspecified site of breast [facility])  CURRENT THERAPY: Xeloda 2000 mg BID days 1-14 every 21 days + XRT beginning on 09/23/2015.  INTERVAL HISTORY: Rebecca Hensley 60 y.o. female returns for followup of Recurrent right breast cancer.  - pT1c N0 (0/3) invasive ductal carcinoma of the RIGHT breast, ER+, PR+, HER2-, grade 2, diagnosed and treated in 2014. She underwent RIGHT mastectomy and SLND with Dr. Carvel Getting at that time. She was seen by medical oncology closer to home at that time. Oncotype DX was 22. Chemotherapy was recommended but she only took 1 cycle of TC before declining further therapy due to side effects. She also declined endocrine therapy at that time. - routine follow up on 02/24/2015 she was noted to have a nodule of the RIGHT chest wall or axilla.  - Ultrasound at the OSH on 03/04/2015 reportedly showed an irregular 1.2 cm mass, hypoechoic centrally and hyperechoic peripherally. Mass was superficial and abutting the chest wall musculature. Several small axillary lymph nodes were also noted but did not appear to have suspicious morphology. Images have not been reviewed in-house.  OSH Bone scan 03/03/15 was negative for metastasis OSH PET scan -Negative OSH MRI Abd/pelvis- Negative Biopsy - 03/23/2015 pathology showed ER+, PR+, HER2- IDC completed 4 cycles of AC chemotherapy with local medical oncologist,  right partial mastectomy 07/22/15     Breast cancer, right breast ([place])   06/27/2012 Initial Diagnosis    Breast cancer, invasive ductal carcinoma with lobular features      09/12/2012 Definitive Surgery    Right mastectomy with sentinel node biopsy      09/12/2012 Pathology Results    2.1 x 2.0 cm primary tumor, grade 2, ER 90%, PR NEGATIVE, HER2 NEGATIVE, ONCOTYPE score 22      01/29/2013 - 02/19/2013 Chemotherapy    TC x 1 cycle       02/18/2013 Adverse Reaction    Patient  decided to stop chemotherapy after 1 cycle due to poor tolerance with side effects.      03/11/2015 Relapse/Recurrence    Positive biopsy from right chest wall nodule.      03/25/2015 PET scan    New hypermetabolic soft tissue nodule right anterior chest wall. Artifact versus small right hepatic metastatic lesion. Consider MRI for further evaluation      04/29/2015 - 06/10/2015 Chemotherapy    Dose-dense Adriamycin/Cytoxan x 4 cycles at [facility].  Last cycle requiring 25% dose reduction due to toxicities.      07/15/2015 Breast MRI    MRI breast- Slight interval decrease in size of biopsy proven recurrence in the right chest wall.No MRI evidence of malignancy in the left breast.      07/22/2015 Procedure    Right partial mastectomy by Dr. Carvel Getting ([place])      07/22/2015 Pathology Results    Right chest wall recurrent invasive ductal carcinoma with lobular features, intermediate grade ([id number]). ER+, PR +, HER2 NEGATIVE.      09/23/2015 Concurrent Chemotherapy    XRT with Xeloda 1250 mg/m2 days 1-14 every 21 days per [facility]: CREAT-X, NEJM 2017. Jun 1;376(22):2147-2159.   XRT beginning on 9/6      She reports weakness and tiredness beginning recently since starting therapy. She notes that since Friday she is then most of her time in bed.  She continues to have issues with insomnia and has been working with  Dr. Maxwell Caul. She is recently prescribed Lunesta.  She is back to work and notes that she is having issues at work. 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 She does not feel this way and notes that her working relationship with others remains good. She notes since being off work "things are different and I'm trying to get things back to the way they were before I left."  She denies  any diarrhea, mouth sores, or palmar-plantar erythrodysesthesia.  Review of Systems  Constitutional: Positive for malaise/fatigue. Negative for chills, fever and weight loss.  HENT: Negative.   Eyes: Negative.   Respiratory: Negative.   Cardiovascular: Negative.   Gastrointestinal: Negative.   Genitourinary: Negative.   Musculoskeletal: Negative.   Skin: Negative.   Neurological: Positive for weakness.  Endo/Heme/Allergies: Negative.   Psychiatric/Behavioral: Negative.     Past Medical History:  Diagnosis Date  . Breast cancer ([place]) 2014  . Cancer ([place])   . Fuchs' corneal dystrophy   . Hyperlipidemia     Past Surgical History:  Procedure Laterality Date  . APPENDECTOMY    . ENDOSCOPIC INSERTION PERITONEAL CATHETER PORT    . EXCISION MASS NECK    . NASAL SEPTUM SURGERY      Family History  Problem Relation Age of Onset  . Breast cancer Mother 62    bilateral cancer, second diagnosis at 27  . Stomach cancer Father 68  . Breast cancer Sister 7    bilateral breast cancer, second dx at 3  . Breast cancer Maternal Aunt     dx in her 70s  . Prostate cancer Maternal Grandfather     dx in his 16s-60s  . Lung cancer Sister 83    smoker  . Breast cancer Maternal Aunt     dx in her 72s  . Breast cancer Cousin     maternal cousin  . Breast cancer Cousin     maternal cousin  . Breast cancer Paternal Aunt 20  . Bone cancer Paternal Uncle     thinks the cancer started somewhere else  . Breast cancer Paternal Aunt     dx in her 43s  . Cancer Cousin     dx in her 34s, a "female" cancer    Social History   Social History  . Marital status: Married    Spouse name: N/A  . Number of children: 2  . Years of education: N/A   Occupational History  .  [employer]   Social History Main Topics  . Smoking status: Former Smoker    Packs/day: 1.00    Years: 30.00    Quit date: 07/18/2002  . Smokeless tobacco: Never Used  . Alcohol use No  . Drug use: No  .  Sexual activity: Not Asked   Other Topics Concern  . None   Social History Narrative  . None     PHYSICAL EXAMINATION  ECOG PERFORMANCE STATUS: 1 - Symptomatic but completely ambulatory  Vitals:   09/28/15 0800  BP: 107/69  Pulse: 91  Resp: 18  Temp: 97.6 F (36.4 C)    GENERAL:alert, no distress, well nourished, well developed, comfortable, cooperative, smiling and unaccompanied SKIN: skin color, texture, turgor are normal, no rashes or significant lesions HEAD: Normocephalic, No masses, lesions, tenderness or  abnormalities EYES: normal, EOMI, Conjunctiva are pink and non-injected EARS: External ears normal OROPHARYNX:lips, buccal mucosa, and tongue normal and mucous membranes are moist  NECK: supple, trachea midline LYMPH:  no palpable lymphadenopathy BREAST:not examined LUNGS: clear to auscultation  HEART: regular rate & rhythm ABDOMEN:abdomen soft and normal bowel sounds BACK: Back symmetric, no curvature. EXTREMITIES:less then 2 second capillary refill, no joint deformities, effusion, or inflammation, no skin discoloration, no cyanosis  NEURO: alert & oriented x 3 with fluent speech, no focal motor/sensory deficits, gait normal   LABORATORY DATA: CBC    Component Value Date/Time   WBC 3.3 (L) 09/28/2015 0922   RBC 4.94 09/28/2015 0922   HGB 13.9 09/28/2015 0922   HGB 12.1 11/20/2012 1509   HCT 41.4 09/28/2015 0922   HCT 36.6 11/20/2012 1509   PLT 170 09/28/2015 0922   PLT 267 11/20/2012 1509   MCV 83.8 09/28/2015 0922   MCV 85.6 11/20/2012 1509   MCH 28.1 09/28/2015 0922   MCHC 33.6 09/28/2015 0922   RDW 12.6 09/28/2015 0922   RDW 13.5 11/20/2012 1509   LYMPHSABS 0.7 09/28/2015 0922   LYMPHSABS 1.6 11/20/2012 1509   MONOABS 0.4 09/28/2015 0922   MONOABS 0.5 11/20/2012 1509   EOSABS 0.0 09/28/2015 0922   EOSABS 0.1 11/20/2012 1509   BASOSABS 0.0 09/28/2015 0922   BASOSABS 0.0 11/20/2012 1509      Chemistry      Component Value Date/Time   NA  138 09/28/2015 0922   NA 142 11/20/2012 1509   K 3.7 09/28/2015 0922   K 3.8 11/20/2012 1509   CL 103 09/28/2015 0922   CO2 27 09/28/2015 0922   CO2 27 11/20/2012 1509   BUN 14 09/28/2015 0922   BUN 14.6 11/20/2012 1509   CREATININE 0.64 09/28/2015 0922   CREATININE 0.8 11/20/2012 1509      Component Value Date/Time   CALCIUM 9.2 09/28/2015 0922   CALCIUM 9.0 11/20/2012 1509   ALKPHOS 69 09/28/2015 0922   ALKPHOS 117 11/20/2012 1509   AST 23 09/28/2015 0922   AST 19 11/20/2012 1509   ALT 30 09/28/2015 0922   ALT 35 11/20/2012 1509   BILITOT 0.4 09/28/2015 0922   BILITOT <0.20 11/20/2012 1509        PENDING LABS:   RADIOGRAPHIC STUDIES:  No results found.   PATHOLOGY:    ASSESSMENT AND PLAN:  Breast cancer, right breast Recurrent right breast cancer.  - pT1c N0 (0/3) invasive ductal carcinoma of the RIGHT breast, ER+, PR+, HER2-, grade 2, diagnosed and treated in 2014. She underwent RIGHT mastectomy and SLND with Dr. Carvel Getting at that time. She was seen by medical oncology closer to home at that time. Oncotype DX was 22. Chemotherapy was recommended but she only took 1 cycle of TC before declining further therapy due to side effects. She also declined endocrine therapy at that time. - routine follow up on 02/24/2015 she was noted to have a nodule of the RIGHT chest wall or axilla.  - Ultrasound at the OSH on 03/04/2015 reportedly showed an irregular 1.2 cm mass, hypoechoic centrally and hyperechoic peripherally. Mass was superficial and abutting the chest wall musculature. Several small axillary lymph nodes were also noted but did not appear to have suspicious morphology. Images have not been reviewed in-house.  OSH Bone scan 03/03/15 was negative for metastasis OSH PET scan -Negative OSH MRI Abd/pelvis- Negative Biopsy - 03/23/2015 pathology showed ER+, PR+, HER2- IDC completed 4 cycles of AC chemotherapy with local  medical oncologist,  right partial mastectomy 07/22/15    Oncology history is updated.  She has seen Dr. Higinio Roger at [facility] on 09/03/2015.  Her recommendations follow: We reviewed Ms. Tomson's previous diagnosis and treatment with her in detail and reviewed staging scans ordered by her local oncologist. I discussed with her that I am happy to serve in a consultative mode for her. I advised that we will provide treatment recommendations to her local providers, however, we cannot prescribe or drive the treatment at another facility. We discussed the challenges of having care administered by two different medical oncologists. She was advised that she could return for treatment here if she chooses to do this.   I reviewed the multidisciplinary tumor board discussion frm 04/13/15. The overall recommendation was for neoadjuvant chemotherapy with [facility] given her poor tolerance to TC chemotherapy in the adjuvant setting.  I have recommended an echo for cardiac evaluation.  Would recommmend chemo teaching and start of AC as soon as echo could be done.  We would plan 4 cycles of AC and then reimage to assess for response. If she could proceed to surgery at that time would consider this versus further reduction in tumor burden with taxol chemotherapy to complete AC-T.   She completed AC x 4 locally with Dr.Eric Neijstrom. She had right partial mastectomy 07/22/15 with Dr.Greenup  Today we reviewed in detail her surgical pathology, which noted residual breast cancer. We discussed the option of further chemotherapy given lack of pathologic CR to [facility]. We discussed the option of completing a more standard chemotherapy regimen by the addition of taxane therapy in the adjuvant setting. She is not inclined to do this. We also reviewed data regarding the addition of xeloda for non-path-CR patients, CREATE-X, NEJM, 2017. Jun 1;376(22):2147-2159. We discussed the option to proceed with radiation therapy combined with radiosensitizing agent Xeloda for better response. This would  also be followed by endocrine therapy with Antihormone medication.   She states that she cannot return for f/u at [facility]. She is establishing care with another medical oncologist locally. We will forward recommendations to her local oncologists.  She will follow up with local medical oncologist to discuss plan.  She knows she also needs to follow up with radiaition oncology I would recommend adjuvant AI therapy for 5-10 years after completion of RT.   She has a follow-up appointment with Dr. Carvel Getting on 10/12/2015 at [facility].  We have reviewed the CREAT-X trial with the patient.  We have reviewed the risks, benefits, alternatives, and side effects of Xeloda treatment including, but not limited to, nausea/vomiting, fatigue, stomatitis, diarrhea, palmar-plantar erythrodysesthesia, anaphylaxis, and death.  Rx is printed for Xeloda 2000 mg BID days 1-14 every 21 days (in accordance with the CREAT-X trial).  She will take Xeloda for 6-8 cycles.  AI therapy will be initiated following completion of XRT after bone density exam is completed.  Labs today: CBC diff, CMET.  I personally reviewed and went over laboratory results with the patient.  The results are noted within this dictation.  Labs are unimpressive.  Labs in 2 weeks: CBC diff, CMET.  She started XRT and Xeloda on 09/23/2015.  She notes issues with insomnia.  She is seeing Dr. Nehemiah Settle with [facility].  She was prescribed Lunesta and this was filled on 09/23/2015. She notes mild improvement in her insomnia.  She notes fatigue and tiredness.  Reporting that she spends most of her day in bed since Friday.  She is still  is working, but this is becoming more difficult since starting concurrent chemoradiation.    She denies any diarrhea, stomatitis, and palmar-plantar erythrodysesthesia.  Return in 2 weeks for follow-up.   ORDERS PLACED FOR THIS ENCOUNTER: No orders of the defined types were placed in this encounter.   MEDICATIONS  PRESCRIBED THIS ENCOUNTER: No orders of the defined types were placed in this encounter.   THERAPY PLAN:  Continue treatment as planned.  All questions were answered. The patient knows to call the clinic with any problems, questions or concerns. We can certainly see the patient much sooner if necessary.  Patient and plan discussed with Dr. Ancil Linsey and she is in agreement with the aforementioned.   This note is electronically signed by: Doy Mince 09/28/2015 10:13 PM

## 2015-09-28 NOTE — Patient Instructions (Signed)
[place] at [facility] Discharge Instructions  RECOMMENDATIONS MADE BY THE CONSULTANT AND ANY TEST RESULTS WILL BE SENT TO YOUR REFERRING PHYSICIAN. Port flushed and labs drawn.  Return as scheduled   Thank you for choosing [place] at [facility] to provide your oncology and hematology care.  To afford each patient quality time with our provider, please arrive at least 15 minutes before your scheduled appointment time.   Beginning January 23rd 2017 lab work for the [facility] will be done in the  Main lab at [facility] on 1st floor. If you have a lab appointment with the [place] please come in thru the  Main Entrance and check in at the main information desk  You need to re-schedule your appointment should you arrive 10 or more minutes late.  We strive to give you quality time with our providers, and arriving late affects you and other patients whose appointments are after yours.  Also, if you no show three or more times for appointments you may be dismissed from the clinic at the providers discretion.     Again, thank you for choosing [facility].  Our hope is that these requests will decrease the amount of time that you wait before being seen by our physicians.       _____________________________________________________________  Should you have questions after your visit to [facility], please contact our office at [phone] between the hours of 8:30 a.m. and 4:30 p.m.  Voicemails left after 4:30 p.m. will not be returned until the following business day.  For prescription refill requests, have your pharmacy contact our office.         Resources For Cancer Patients and their Caregivers ? American Cancer Society: Can assist with transportation, wigs, general needs, runs Look Good Feel Better.        416-290-7843 ? Cancer Care: Provides financial assistance, online support groups,  medication/co-pay assistance.  1-800-813-HOPE 437-322-6055) ? [place] Assists [place] cancer patients and their families through emotional , educational and financial support.  331-121-6206 ? [place] DSS Where to apply for food stamps, Medicaid and utility assistance. 930-428-6679 ? RCATS: Transportation to medical appointments. (985)337-2952 ? Social Security Administration: May apply for disability if have a Stage IV cancer. 302-319-8282 620 790 5008 ? [employer], Disability and Transit Services: Assists with nutrition, care and transit needs. [place] Support Programs: @10RELATIVEDAYS @ > Cancer Support Group  2nd Tuesday of the month 1pm-2pm, Journey Room  > Creative Journey  3rd Tuesday of the month 1130am-1pm, Journey Room  > Look Good Feel Better  1st Wednesday of the month 10am-12 noon, Journey Room (Call [place] to register [phone])

## 2015-09-28 NOTE — Patient Instructions (Addendum)
[place] at [facility] Discharge Instructions  RECOMMENDATIONS MADE BY THE CONSULTANT AND ANY TEST RESULTS WILL BE SENT TO YOUR REFERRING PHYSICIAN.  You were seen by Gershon Mussel today. Return to the center in 2 weeks for follow up and labs. Please call the center with any related concerns.  Thank you for choosing [place] at [facility] to provide your oncology and hematology care.  To afford each patient quality time with our provider, please arrive at least 15 minutes before your scheduled appointment time.   Beginning January 23rd 2017 lab work for the [facility] will be done in the  Main lab at [facility] on 1st floor. If you have a lab appointment with the [facility] please come in thru the  Main Entrance and check in at the main information desk  You need to re-schedule your appointment should you arrive 10 or more minutes late.  We strive to give you quality time with our providers, and arriving late affects you and other patients whose appointments are after yours.  Also, if you no show three or more times for appointments you may be dismissed from the clinic at the providers discretion.     Again, thank you for choosing [facility].  Our hope is that these requests will decrease the amount of time that you wait before being seen by our physicians.       _____________________________________________________________  Should you have questions after your visit to [facility], please contact our office at [phone] between the hours of 8:30 a.m. and 4:30 p.m.  Voicemails left after 4:30 p.m. will not be returned until the following business day.  For prescription refill requests, have your pharmacy contact our office.         Resources For Cancer Patients and their Caregivers ? American Cancer Society: Can assist with transportation, wigs, general needs, runs Look Good Feel Better.         639-227-3938 ? Cancer Care: Provides financial assistance, online support groups, medication/co-pay assistance.  1-800-813-HOPE 989-056-0108) ? [place] Assists [place] cancer patients and their families through emotional , educational and financial support.  325-077-4849 ? [place] DSS Where to apply for food stamps, Medicaid and utility assistance. (667)540-3429 ? RCATS: Transportation to medical appointments. (720)670-8347 ? Social Security Administration: May apply for disability if have a Stage IV cancer. 872-717-2322 (832)079-9026 ? [employer], Disability and Transit Services: Assists with nutrition, care and transit needs. [place] Support Programs: @10RELATIVEDAYS @ > Cancer Support Group  2nd Tuesday of the month 1pm-2pm, Journey Room  > Creative Journey  3rd Tuesday of the month 1130am-1pm, Journey Room  > Look Good Feel Better  1st Wednesday of the month 10am-12 noon, Journey Room (Call [place] to register [phone])

## 2015-09-28 NOTE — Progress Notes (Signed)
Pt here today for visit and to have port flushed with labs drawn. Rebecca Hensley presented for Portacath access and flush. Portacath located rightchest wall accessed with  H 20 needle. Good blood return present. Labs drawn and sent to lab. Portacath flushed with 11ml NS and 500U/19ml Heparin and needle removed intact. Procedure without incident. Patient tolerated procedure well.

## 2015-09-28 NOTE — Assessment & Plan Note (Addendum)
Recurrent right breast cancer.  - pT1c N0 (0/3) invasive ductal carcinoma of the RIGHT breast, ER+, PR+, HER2-, grade 2, diagnosed and treated in 2014. She underwent RIGHT mastectomy and SLND with Dr. Greenup at that time. She was seen by medical oncology closer to home at that time. Oncotype DX was 22. Chemotherapy was recommended but she only took 1 cycle of TC before declining further therapy due to side effects. She also declined endocrine therapy at that time. - routine follow up on 02/24/2015 she was noted to have a nodule of the RIGHT chest wall or axilla.  - Ultrasound at the OSH on 03/04/2015 reportedly showed an irregular 1.2 cm mass, hypoechoic centrally and hyperechoic peripherally. Mass was superficial and abutting the chest wall musculature. Several small axillary lymph nodes were also noted but did not appear to have suspicious morphology. Images have not been reviewed in-house.  OSH Bone scan 03/03/15 was negative for metastasis OSH PET scan -Negative OSH MRI Abd/pelvis- Negative Biopsy - 03/23/2015 pathology showed ER+, PR+, HER2- IDC completed 4 cycles of AC chemotherapy with local medical oncologist,  right partial mastectomy 07/22/15   Oncology history is updated.  She has seen Dr. Kelly Weestbrook at [facility] on 09/03/2015.  Her recommendations follow: We reviewed Ms. Asato's previous diagnosis and treatment with her in detail and reviewed staging scans ordered by her local oncologist. I discussed with her that I am happy to serve in a consultative mode for her. I advised that we will provide treatment recommendations to her local providers, however, we cannot prescribe or drive the treatment at another facility. We discussed the challenges of having care administered by two different medical oncologists. She was advised that she could return for treatment here if she chooses to do this.   I reviewed the multidisciplinary tumor board discussion frm 04/13/15. The overall recommendation was  for neoadjuvant chemotherapy with AC given her poor tolerance to TC chemotherapy in the adjuvant setting.  I have recommended an echo for cardiac evaluation.  Would recommmend chemo teaching and start of AC as soon as echo could be done.  We would plan 4 cycles of AC and then reimage to assess for response. If she could proceed to surgery at that time would consider this versus further reduction in tumor burden with taxol chemotherapy to complete AC-T.   She completed AC x 4 locally with Dr.Eric Neijstrom. She had right partial mastectomy 07/22/15 with Dr.Greenup  Today we reviewed in detail her surgical pathology, which noted residual breast cancer. We discussed the option of further chemotherapy given lack of pathologic CR to AC. We discussed the option of completing a more standard chemotherapy regimen by the addition of taxane therapy in the adjuvant setting. She is not inclined to do this. We also reviewed data regarding the addition of xeloda for non-path-CR patients, CREATE-X, NEJM, 2017. Jun 1;376(22):2147-2159. We discussed the option to proceed with radiation therapy combined with radiosensitizing agent Xeloda for better response. This would also be followed by endocrine therapy with Antihormone medication.   She states that she cannot return for f/u at [facility]. She is establishing care with another medical oncologist locally. We will forward recommendations to her local oncologists.  She will follow up with local medical oncologist to discuss plan.  She knows she also needs to follow up with radiaition oncology I would recommend adjuvant AI therapy for 5-10 years after completion of RT.   She has a follow-up appointment with Dr. Greenup on 10/12/2015 at [facility].    We have reviewed the CREAT-X trial with the patient.  We have reviewed the risks, benefits, alternatives, and side effects of Xeloda treatment including, but not limited to, nausea/vomiting, fatigue, stomatitis, diarrhea, palmar-plantar  erythrodysesthesia, anaphylaxis, and death.  Rx is printed for Xeloda 2000 mg BID days 1-14 every 21 days (in accordance with the CREAT-X trial).  She will take Xeloda for 6-8 cycles.  AI therapy will be initiated following completion of XRT after bone density exam is completed.  Labs today: CBC diff, CMET.  I personally reviewed and went over laboratory results with the patient.  The results are noted within this dictation.  Labs are unimpressive.  Labs in 2 weeks: CBC diff, CMET.  She started XRT and Xeloda on 09/23/2015.  She notes issues with insomnia.  She is seeing Dr. Nehemiah Settle with [facility].  She was prescribed Lunesta and this was filled on 09/23/2015. She notes mild improvement in her insomnia.  She notes fatigue and tiredness.  Reporting that she spends most of her day in bed since Friday.  She is still is working, but this is becoming more difficult since starting concurrent chemoradiation.    She denies any diarrhea, stomatitis, and palmar-plantar erythrodysesthesia.  Return in 2 weeks for follow-up.

## 2015-10-03 ENCOUNTER — Encounter (HOSPITAL_COMMUNITY): Payer: Self-pay | Admitting: Oncology

## 2015-10-05 ENCOUNTER — Other Ambulatory Visit (HOSPITAL_COMMUNITY): Payer: Self-pay | Admitting: Pharmacist

## 2015-10-06 ENCOUNTER — Encounter: Payer: Self-pay | Admitting: *Deleted

## 2015-10-06 NOTE — Progress Notes (Signed)
[facility] Clinical Social Work  Clinical Social Work was referred by PA for assessment of psychosocial needs due to work related issues. Clinical Social Worker contacted patient at home to offer support and assess for needs.  CSW introduced self, explained role of social work/Support Programs. Pt reports she has decided to cut back on her work again as it "was just too much" with treatment. Pt continues to struggle with insomnia, but feels she is working well with her specialist to get this resolved. CSW reviewed support program options for support. Pt currently too tired to consider due to chemo and radiation right now, but will consider for future. Pt denied other needs currently and will reach out as needed.     Clinical Social Work interventions: Resource education Supportive listening  [place], [place] Tuesdays   [phone]

## 2015-10-08 ENCOUNTER — Other Ambulatory Visit (HOSPITAL_COMMUNITY): Payer: Self-pay | Admitting: Oncology

## 2015-10-08 ENCOUNTER — Encounter (HOSPITAL_COMMUNITY): Payer: BLUE CROSS/BLUE SHIELD | Admitting: Genetic Counselor

## 2015-10-08 DIAGNOSIS — C50911 Malignant neoplasm of unspecified site of right female breast: Secondary | ICD-10-CM

## 2015-10-08 MED ORDER — CAPECITABINE 500 MG PO TABS: 2000.0000 mg | ORAL_TABLET | Freq: Two times a day (BID) | ORAL | 1 refills | Status: DC

## 2015-10-09 ENCOUNTER — Ambulatory Visit (HOSPITAL_COMMUNITY): Payer: BLUE CROSS/BLUE SHIELD | Admitting: Hematology & Oncology

## 2015-10-09 ENCOUNTER — Other Ambulatory Visit (HOSPITAL_COMMUNITY): Payer: BLUE CROSS/BLUE SHIELD

## 2015-10-13 ENCOUNTER — Telehealth (HOSPITAL_COMMUNITY): Payer: Self-pay | Admitting: Emergency Medicine

## 2015-10-13 NOTE — Telephone Encounter (Signed)
Spoke with [facility] to verify the xeloda prescription.  It will be 112 tablets not 56 tablets.

## 2015-10-19 ENCOUNTER — Telehealth (HOSPITAL_COMMUNITY): Payer: Self-pay | Admitting: *Deleted

## 2015-10-19 NOTE — Telephone Encounter (Signed)
-----   Message from [facility], PA-C sent at 10/15/2015  1:21 PM EDT ----- She is taking correctly.  After XRT, she continues Xeloda as prescribed  TK  ----- Message ----- From: Farley Ly, LPN Sent: [id number]  12:35 PM To: B[facility], PA-C  Patient called asking if she was taking Xeloda correctly. Reports she took 2 pills twice daily x 2 weeks, missed a week and has restarted pills again today. Received #112 pills total first shipment. Received another shipment yesterday and has received a call trying to set up another delivery for xeloda. Instructed her per your note she is taking xeloda correctly. Has enough pills in 1st bottle to finish 2 weeks. Thinks she finishes radiation around 10/8. She is not sure what she is supposed to do with xeloda after that. Next MD appt here 10/18. Please advise.

## 2015-10-19 NOTE — Telephone Encounter (Signed)
Unable to reach pt, will try again tomorrow.

## 2015-10-20 NOTE — Telephone Encounter (Signed)
Pt aware that she is taking the medication correctly and to continue to take the medication after her radiation .

## 2015-11-04 ENCOUNTER — Encounter (HOSPITAL_COMMUNITY): Payer: BLUE CROSS/BLUE SHIELD | Attending: Oncology | Admitting: Hematology & Oncology

## 2015-11-04 ENCOUNTER — Encounter (HOSPITAL_COMMUNITY): Payer: BLUE CROSS/BLUE SHIELD

## 2015-11-04 VITALS — BP 113/70 | HR 88 | Temp 98.4°F | Resp 16 | Wt 168.7 lb

## 2015-11-04 DIAGNOSIS — Z170 Estrogen receptor positive status [ER+]: Secondary | ICD-10-CM

## 2015-11-04 DIAGNOSIS — C50911 Malignant neoplasm of unspecified site of right female breast: Secondary | ICD-10-CM

## 2015-11-04 DIAGNOSIS — C50812 Malignant neoplasm of overlapping sites of left female breast: Secondary | ICD-10-CM | POA: Diagnosis not present

## 2015-11-04 DIAGNOSIS — Z9011 Acquired absence of right breast and nipple: Secondary | ICD-10-CM

## 2015-11-04 DIAGNOSIS — Z95828 Presence of other vascular implants and grafts: Secondary | ICD-10-CM | POA: Diagnosis present

## 2015-11-04 DIAGNOSIS — C7989 Secondary malignant neoplasm of other specified sites: Secondary | ICD-10-CM

## 2015-11-04 DIAGNOSIS — Z853 Personal history of malignant neoplasm of breast: Secondary | ICD-10-CM | POA: Diagnosis not present

## 2015-11-04 DIAGNOSIS — Z5111 Encounter for antineoplastic chemotherapy: Secondary | ICD-10-CM

## 2015-11-04 LAB — COMPREHENSIVE METABOLIC PANEL
ALBUMIN: 3.9 g/dL (ref 3.5–5.0)
ALK PHOS: 94 U/L (ref 38–126)
ALT: 43 U/L (ref 14–54)
AST: 30 U/L (ref 15–41)
Anion gap: 7 (ref 5–15)
BUN: 14 mg/dL (ref 6–20)
CALCIUM: 8.9 mg/dL (ref 8.9–10.3)
CHLORIDE: 102 mmol/L (ref 101–111)
CO2: 29 mmol/L (ref 22–32)
CREATININE: 0.66 mg/dL (ref 0.44–1.00)
GFR calc Af Amer: >60 mL/min (ref >60)
GFR calc non Af Amer: >60 mL/min (ref >60)
Glucose, Bld: 107 mg/dL — ABNORMAL HIGH (ref 65–99)
Potassium: 4.1 mmol/L (ref 3.5–5.1)
SODIUM: 138 mmol/L (ref 135–145)
Total Bilirubin: 0.6 mg/dL (ref 0.3–1.2)
Total Protein: 6.9 g/dL (ref 6.5–8.1)

## 2015-11-04 LAB — CBC WITH DIFFERENTIAL/PLATELET
BASOS ABS: 0 10*3/uL (ref 0.0–0.1)
BASOS PCT: 0 %
EOS ABS: 0 10*3/uL (ref 0.0–0.7)
Eosinophils Relative: 1 %
HCT: 36.8 % (ref 36.0–46.0)
HEMOGLOBIN: 12.5 g/dL (ref 12.0–15.0)
LYMPHS PCT: 17 %
Lymphs Abs: 0.6 10*3/uL — ABNORMAL LOW (ref 0.7–4.0)
MCH: 29.3 pg (ref 26.0–34.0)
MCHC: 34 g/dL (ref 30.0–36.0)
MCV: 86.2 fL (ref 78.0–100.0)
MONO ABS: 0.3 10*3/uL (ref 0.1–1.0)
Monocytes Relative: 9 %
NEUTROS PCT: 73 %
Neutro Abs: 2.4 10*3/uL (ref 1.7–7.7)
PLATELETS: 115 10*3/uL — AB (ref 150–400)
RBC: 4.27 MIL/uL (ref 3.87–5.11)
RDW: 16.9 % — ABNORMAL HIGH (ref 11.5–15.5)
WBC: 3.3 10*3/uL — AB (ref 4.0–10.5)

## 2015-11-04 NOTE — Patient Instructions (Signed)
[place] at [facility] Discharge Instructions  RECOMMENDATIONS MADE BY THE CONSULTANT AND ANY TEST RESULTS WILL BE SENT TO YOUR REFERRING PHYSICIAN.  You were seen by Dr. Whitney Muse today. Return to [facility] November 19th, with labs.  Thank you for choosing [place] at [facility] to provide your oncology and hematology care.  To afford each patient quality time with our provider, please arrive at least 15 minutes before your scheduled appointment time.   Beginning January 23rd 2017 lab work for the [facility] will be done in the  Main lab at [facility] on 1st floor. If you have a lab appointment with the [facility] please come in thru the  Main Entrance and check in at the main information desk  You need to re-schedule your appointment should you arrive 10 or more minutes late.  We strive to give you quality time with our providers, and arriving late affects you and other patients whose appointments are after yours.  Also, if you no show three or more times for appointments you may be dismissed from the clinic at the providers discretion.     Again, thank you for choosing [facility].  Our hope is that these requests will decrease the amount of time that you wait before being seen by our physicians.       _____________________________________________________________  Should you have questions after your visit to [facility], please contact our office at [phone] between the hours of 8:30 a.m. and 4:30 p.m.  Voicemails left after 4:30 p.m. will not be returned until the following business day.  For prescription refill requests, have your pharmacy contact our office.         Resources For Cancer Patients and their Caregivers ? American Cancer Society: Can assist with transportation, wigs, general needs, runs Look Good Feel Better.        219 716 8581 ? Cancer Care: Provides financial assistance, online  support groups, medication/co-pay assistance.  1-800-813-HOPE 709-607-4617) ? [facility] Assists [place] cancer patients and their families through emotional , educational and financial support.  (938) 783-2347 ? [place] DSS Where to apply for food stamps, Medicaid and utility assistance. (615)752-4695 ? [facility]: Transportation to medical appointments. 343-234-7622 ? Social Security Administration: May apply for disability if have a Stage IV cancer. 973-666-0880 (424)347-7585 ? [employer], Disability and Transit Services: Assists with nutrition, care and transit needs. [place] Support Programs: @10RELATIVEDAYS @ > Cancer Support Group  2nd Tuesday of the month 1pm-2pm, Journey Room  > Creative Journey  3rd Tuesday of the month 1130am-1pm, Journey Room  > Look Good Feel Better  1st Wednesday of the month 10am-12 noon, Journey Room (Call [place] to register [phone])

## 2015-11-04 NOTE — Progress Notes (Signed)
[facility] Hematology/Oncology Progress Note  Name: Rebecca Hensley      MRN: [id number]   Date: 11/04/2015 Time:12:52 PM   REFERRING PHYSICIAN:  Everardo All, MD (Medical Oncology)  REASON FOR CONSULT:  Transfer of medical oncology care   DIAGNOSIS:  Locally recurrent right chest wall breast cancer.    Chest wall recurrence of right breast cancer ([place])   06/27/2012 Initial Diagnosis    Breast cancer, invasive ductal carcinoma with lobular features      09/12/2012 Definitive Surgery    Right mastectomy with sentinel node biopsy      09/12/2012 Pathology Results    2.1 x 2.0 cm primary tumor, grade 2, ER 90%, PR NEGATIVE, HER2 NEGATIVE, ONCOTYPE score 22      01/29/2013 - 02/19/2013 Chemotherapy    TC x 1 cycle       02/18/2013 Adverse Reaction    Patient decided to stop chemotherapy after 1 cycle due to poor tolerance with side effects.      03/11/2015 Relapse/Recurrence    Positive biopsy from right chest wall nodule.      03/25/2015 PET scan    New hypermetabolic soft tissue nodule right anterior chest wall. Artifact versus small right hepatic metastatic lesion. Consider MRI for further evaluation      04/29/2015 - 06/10/2015 Chemotherapy    Dose-dense Adriamycin/Cytoxan x 4 cycles at [facility].  Last cycle requiring 25% dose reduction due to toxicities.      07/15/2015 Breast MRI    MRI breast- Slight interval decrease in size of biopsy proven recurrence in the right chest wall.No MRI evidence of malignancy in the left breast.      07/22/2015 Procedure    Right partial mastectomy by Dr. Carvel Getting ([place])       07/22/2015 Pathology Results    Right chest wall recurrent invasive ductal carcinoma with lobular features, intermediate grade ([id number]). ER+, PR +, HER2 NEGATIVE.      09/23/2015 Concurrent Chemotherapy    XRT with Xeloda 1250 mg/m2 days 1-14 every 21 days per [facility]: CREAT-X, NEJM 2017. Jun 1;376(22):2147-2159.   XRT beginning on 9/6        HISTORY OF PRESENT ILLNESS:   Rebecca Hensley with a medical history significant for breast cancer who returns today for ongoing follow-up of locally recurrent invasive ductal carcinoma with lobular features of right breast with recurrence to chest wall. She is followed at [facility], she was treated locally by Dr. Tressie Stalker, last seen on 06/17/2015. With the closure of [facility] she has transferred care here.  [name] returns to the [place] unaccompanied.  She completed radiation treatment last Thursday, 10/12. She has been using the creams given as directed.   She would like a week off of Xeloda to allow for a trip to the beach. She is scheduled to start Xeloda tomorrow. She is leaving for the beach this Saturday (10/21) and returning the following Saturday (10/28). Instead, she will start back the Sunday she returns (11/15/2015).   She has not had issue receiving her Xeloda by mail. Though she does note that she has been sent two bottles and was contacted about receiving a third bottle. She was also contacted about it being switched to a new specialty pharmacy. She will contact our office if she runs into any issues receiving her medication.  She denies any issues with her hands and feet. She denies any issues with her mouth. She  denies diarrhea. She does report a little bit of constipation. She denies nausea or vomiting.   She put in for an extension on her short term disability.    She has already received a flu shot this season.   Review of Systems  Constitutional: Negative for chills, fever and weight loss.  HENT: Negative.  Negative for congestion and nosebleeds.   Eyes: Positive for blurred vision (nearsighted). Negative for double vision, discharge and redness.  Respiratory: Negative.  Negative for cough and hemoptysis.   Cardiovascular: Negative.  Negative for chest pain.  Gastrointestinal: Positive for constipation ("little bit"). Negative for abdominal pain,  blood in stool, diarrhea, heartburn, nausea and vomiting.  Genitourinary: Negative.   Musculoskeletal: Positive for joint pain. Negative for back pain, myalgias and neck pain.  Skin: Negative.   Neurological: Negative.  Negative for dizziness, sensory change, speech change, focal weakness, seizures, loss of consciousness, weakness and headaches.  Endo/Heme/Allergies: Negative.   Psychiatric/Behavioral: The patient is nervous/anxious.   14 point review of systems was performed and is negative except as detailed under history of present illness and above    PAST MEDICAL HISTORY:   Past Medical History:  Diagnosis Date  . Breast cancer ([place]) 2014  . Cancer ([place])   . Fuchs' corneal dystrophy   . Hyperlipidemia     ALLERGIES: Allergies  Allergen Reactions  . Codeine Nausea Only  . Gabapentin Other (See Comments)      MEDICATIONS: I have reviewed the patient's current medications.    Current Outpatient Prescriptions on File Prior to Visit  Medication Sig Dispense Refill  . acetaminophen (TYLENOL) 500 MG tablet Take by mouth.    . ALPRAZolam (XANAX) 0.5 MG tablet Take 1 tablet (0.5 mg total) by mouth 3 (three) times daily as needed for anxiety. 45 tablet 0  . Ascorbic Acid (VITAMIN C) 1000 MG tablet Take 1,000 mg by mouth daily.    . capecitabine (XELODA) 500 MG tablet Take 4 tablets (2,000 mg total) by mouth 2 (two) times daily after a meal. 56 tablet 1  . Cholecalciferol (VITAMIN D3) 1000 UNITS CAPS Take by mouth.    . Cyanocobalamin (RA VITAMIN B-12 TR) 1000 MCG TBCR Take by mouth.    . eszopiclone (LUNESTA) 2 MG TABS tablet Take 2 mg by mouth at bedtime as needed for sleep. Take immediately before bedtime    . Flaxseed, Linseed, (FLAXSEED OIL) 1000 MG CAPS Take 1,000 mg by mouth daily.    . Iodine, Kelp, (KELP PO) Take 600 mg by mouth daily.    . montelukast (SINGULAIR) 10 MG tablet     . omeprazole (PRILOSEC) 40 MG capsule Take by mouth.    . ondansetron (ZOFRAN) 8 MG tablet  Take 1 tablet (8 mg total) by mouth every 8 (eight) hours as needed for nausea or vomiting. 30 tablet 2  . prochlorperazine (COMPAZINE) 10 MG tablet Take 1 tablet (10 mg total) by mouth every 6 (six) hours as needed for nausea or vomiting. 30 tablet 2  . venlafaxine XR (EFFEXOR-XR) 150 MG 24 hr capsule Take by mouth.    . vitamin A 8000 UNIT capsule Take 8,000 Units by mouth daily.    . vitamin E 400 UNIT capsule Take 400 Units by mouth daily.     No current facility-administered medications on file prior to visit.      PAST SURGICAL HISTORY Past Surgical History:  Procedure Laterality Date  . APPENDECTOMY    . ENDOSCOPIC INSERTION PERITONEAL CATHETER PORT    .  EXCISION MASS NECK    . NASAL SEPTUM SURGERY      FAMILY HISTORY: Family History  Problem Relation Age of Onset  . Breast cancer Mother 23    bilateral cancer, second diagnosis at 35  . Stomach cancer Father 53  . Breast cancer Sister 58    bilateral breast cancer, second dx at 58  . Breast cancer Maternal Aunt     dx in her 72s  . Prostate cancer Maternal Grandfather     dx in his 27s-60s  . Lung cancer Sister 74    smoker  . Breast cancer Maternal Aunt     dx in her 62s  . Breast cancer Cousin     maternal cousin  . Breast cancer Cousin     maternal cousin  . Breast cancer Paternal Aunt 2  . Bone cancer Paternal Uncle     thinks the cancer started somewhere else  . Breast cancer Paternal Aunt     dx in her 53s  . Cancer Cousin     dx in her 39s, a "Hensley" cancer    SOCIAL HISTORY:  reports that she quit smoking about 13 years ago. She has a 30.00 pack-year smoking history. She has never used smokeless tobacco. She reports that she does not drink alcohol or use drugs.  Social History   Social History  . Marital status: Married    Spouse name: N/A  . Number of children: 2  . Years of education: N/A   Occupational History  .  [employer]   Social History Main Topics  . Smoking status:  Former Smoker    Packs/day: 1.00    Years: 30.00    Quit date: 07/18/2002  . Smokeless tobacco: Never Used  . Alcohol use No  . Drug use: No  . Sexual activity: Not on file   Other Topics Concern  . Not on file   Social History Narrative  . No narrative on file    PERFORMANCE STATUS: The patient's performance status is 1 - Symptomatic but completely ambulatory  PHYSICAL EXAM: Most Recent Vital Signs: Blood pressure 113/70, pulse 88, temperature 98.4 F (36.9 C), temperature source Oral, resp. rate 16, weight 168 lb 11.2 oz (76.5 kg), SpO2 98 %. Physical Exam  Constitutional: She is oriented to person, place, and time and well-developed, well-nourished, and in no distress.  HENT:  Head: Normocephalic and atraumatic.  Mouth/Throat: Oropharynx is clear and moist.  Eyes: Conjunctivae and EOM are normal. Pupils are equal, round, and reactive to light.  Neck: Normal range of motion. Neck supple.  Cardiovascular: Normal rate, regular rhythm and normal heart sounds.   Pulmonary/Chest: Effort normal and breath sounds normal.  Abdominal: Soft. Bowel sounds are normal. There is no tenderness.  Musculoskeletal: Normal range of motion.  Neurological: She is alert and oriented to person, place, and time. Gait normal.  Skin: Skin is warm and dry.  Nursing note and vitals reviewed.   LABORATORY DATA:  Results for orders placed or performed in visit on 11/04/15 (from the past 48 hour(s))  CBC with Differential     Status: Abnormal   Collection Time: 11/04/15 11:23 AM  Result Value Ref Range   WBC 3.3 (L) 4.0 - 10.5 K/uL   RBC 4.27 3.87 - 5.11 MIL/uL   Hemoglobin 12.5 12.0 - 15.0 g/dL   HCT 36.8 36.0 - 46.0 %   MCV 86.2 78.0 - 100.0 fL   MCH 29.3 26.0 - 34.0 pg  MCHC 34.0 30.0 - 36.0 g/dL   RDW 38.3 (H) 91.9 - 73.7 %   Platelets 115 (L) 150 - 400 K/uL   Neutrophils Relative % 73 %   Lymphocytes Relative 17 %   Monocytes Relative 9 %   Eosinophils Relative 1 %   Basophils Relative  0 %   Neutro Abs 2.4 1.7 - 7.7 K/uL   Lymphs Abs 0.6 (L) 0.7 - 4.0 K/uL   Monocytes Absolute 0.3 0.1 - 1.0 K/uL   Eosinophils Absolute 0.0 0.0 - 0.7 K/uL   Basophils Absolute 0.0 0.0 - 0.1 K/uL   Smear Review PLATELET COUNT CONFIRMED BY SMEAR     Comment: PLATELETS APPEAR DECREASED  Comprehensive metabolic panel     Status: Abnormal   Collection Time: 11/04/15 11:23 AM  Result Value Ref Range   Sodium 138 135 - 145 mmol/L   Potassium 4.1 3.5 - 5.1 mmol/L   Chloride 102 101 - 111 mmol/L   CO2 29 22 - 32 mmol/L   Glucose, Bld 107 (H) 65 - 99 mg/dL   BUN 14 6 - 20 mg/dL   Creatinine, Ser 6.22 0.44 - 1.00 mg/dL   Calcium 8.9 8.9 - 33.1 mg/dL   Total Protein 6.9 6.5 - 8.1 g/dL   Albumin 3.9 3.5 - 5.0 g/dL   AST 30 15 - 41 U/L   ALT 43 14 - 54 U/L   Alkaline Phosphatase 94 38 - 126 U/L   Total Bilirubin 0.6 0.3 - 1.2 mg/dL   GFR calc non Af Amer >60 >60 mL/min   GFR calc Af Amer >60 >60 mL/min    Comment: (NOTE) The eGFR has been calculated using the CKD EPI equation. This calculation has not been validated in all clinical situations. eGFR's persistently <60 mL/min signify possible Chronic Kidney Disease.    Anion gap 7 5 - 15          RADIOGRAPHY: I have personally reviewed the radiological images as listed and agreed with the findings in the report.   PATHOLOGY:    A.Chest wall, right (excision):  Intermediate-grade carcinoma involving prior surgical site, consistent with clinically recurrent invasive ductal carcinoma with lobular features, is identified infiltrating an area measuring 19 mm; skin is present and not involved by carcinoma.  Surgical resection margins are negative for invasive carcinoma, with closest superior (by 0.5 mm) and inferior (by 2.5 mm) margins.   Breast cancer biomarker studies:Pending.  Paraffin block number:A7.  Results will be issued in a separate report from the Image Cytometry Laboratory.   B.Lymph nodes, right  axillary (excision):  Multiple lymph nodes are negative for metastatic carcinoma (0 of 5).    Right chest wall containing intermediate-grade carcinoma, consistent with clinically recurrent invasive ductal carcinoma with lobular features([id number], Block A7; Cold Ischemia Time:2 hours, 41 minutes, Formalin Fixation Time:20 hours).   INTERPRETATION  ER INTERPRETATION: POSITIVE ESTROGEN RECEPTOR ACTIVITY (ALLRED SCORE = 8).  PR INTERPRETATION: POSITIVE PROGESTERONE RECEPTOR ACTIVITY (ALLRED SCORE = 4).   HER2/neu IMMUNOHISTOCHEMISTRY INTERPRETATION:NEGATIVE (0) FOR OVEREXPRESSION OF HER2/NEU ONCOPROTEIN.      ASSESSMENT/PLAN:  Breast cancer, right breast 2014 T1c, N0 R Mastectomy Oncotype DX score 22 TC X 1, declined endocrine therapy Right chest wall recurrence, axillary tail ER+ PR+ HER 2 - AC X 4 Resection with LN dissection at [facility] Fatigue  Recurrent right breast cancer.  - pT1c N0 (0/3) invasive ductal carcinoma of the RIGHT breast, ER+, PR+, HER2-, grade 2, diagnosed and treated in 2014. She underwent RIGHT  mastectomy and SLND with Dr. Carvel Getting at that time. She was seen by medical oncology closer to home at that time. Oncotype DX was 22. Chemotherapy was recommended but she only took 1 cycle of TC before declining further therapy due to side effects. She also declined endocrine therapy at that time. - routine follow up on 02/24/2015 she was noted to have a nodule of the RIGHT chest wall or axilla.  - Ultrasound at the OSH on 03/04/2015 reportedly showed an irregular 1.2 cm mass, hypoechoic centrally and hyperechoic peripherally. Mass was superficial and abutting the chest wall musculature. Several small axillary lymph nodes were also noted but did not appear to have suspicious morphology. Images have not been reviewed in-house.  OSH Bone scan 03/03/15 was negative for metastasis OSH PET scan -Negative OSH MRI Abd/pelvis- Negative Biopsy - 03/23/2015 pathology showed ER+, PR+,  HER2- IDC completed 4 cycles of AC chemotherapy with local medical oncologist,  right partial mastectomy with LN dissection 07/22/15   She has seen Dr. Higinio Roger at [facility] on 09/03/2015.  Her recommendations follow: We reviewed Ms. Weiss's previous diagnosis and treatment with her in detail and reviewed staging scans ordered by her local oncologist. I discussed with her that I am happy to serve in a consultative mode for her. I advised that we will provide treatment recommendations to her local providers, however, we cannot prescribe or drive the treatment at another facility. We discussed the challenges of having care administered by two different medical oncologists. She was advised that she could return for treatment here if she chooses to do this.   I reviewed the multidisciplinary tumor board discussion frm 04/13/15. The overall recommendation was for neoadjuvant chemotherapy with [facility] given her poor tolerance to TC chemotherapy in the adjuvant setting.  I have recommended an echo for cardiac evaluation.  Would recommmend chemo teaching and start of AC as soon as echo could be done.  We would plan 4 cycles of AC and then reimage to assess for response. If she could proceed to surgery at that time would consider this versus further reduction in tumor burden with taxol chemotherapy to complete AC-T.   She completed AC x 4 locally with Dr.Eric Neijstrom. She had right partial mastectomy 07/22/15 with Dr.Greenup  Today we reviewed in detail her surgical pathology, which noted residual breast cancer. We discussed the option of further chemotherapy given lack of pathologic CR to [facility]. We discussed the option of completing a more standard chemotherapy regimen by the addition of taxane therapy in the adjuvant setting. She is not inclined to do this. We also reviewed data regarding the addition of xeloda for non-path-CR patients, CREATE-X, NEJM, 2017. Jun 1;376(22):2147-2159. We discussed the option to  proceed with radiation therapy combined with radiosensitizing agent Xeloda for better response. This would also be followed by endocrine therapy with Antihormone medication.   She states that she cannot return for f[facility]u at [facility] She is establishing care with another medical oncologist locally. We will forward recommendations to her local oncologists.  She will follow up with local medical oncologist to discuss plan.  She knows she also needs to follow up with radiaition oncology I would recommend adjuvant AI therapy for 5-10 years after completion of RT.   We have reviewed the CREAT-X trial with the patient.  We have reviewed the risks, benefits, alternatives, and side effects of Xeloda treatment including, but not limited to, nausea/vomiting, fatigue, stomatitis, diarrhea, palmar-plantar erythrodysesthesia, anaphylaxis, and death.  Rx is printed for Xeloda  2000 mg BID days 1-14 every 21 days (in accordance with the CREAT-X trial).  She will take Xeloda for 6-8 cycles.  AI therapy will be initiated following completion of XRT after bone density exam is completed.  Labs reviewed with the patient in detail. Results are noted above.   She completed radiation treatment last Thursday, 10/12.  She would like a week off of Xeloda to allow for a trip to the beach. She is scheduled to start Xeloda tomorrow. She is leaving for the beach this Saturday (10/21) and returning the following Saturday (10/28). Instead, she will start back the Sunday she returns (11/15/2015).  She has requested to extend her short term disability. She will speak with the front about this today.   She will return for follow up November 20th. Will discuss AI therapy at her follow-up as noted in the note review from from her medonc at [facility].  I again reviewed with the patient the total duration of XELODA therapy.She will need a DEXA. We can order this at follow-up as well.   ORDERS PLACED FOR THIS ENCOUNTER: No orders of the defined  types were placed in this encounter.  Orders Placed This Encounter  Procedures  . CBC with Differential    Standing Status:   Future    Standing Expiration Date:   11/03/2016  . Comprehensive metabolic panel    Standing Status:   Future    Standing Expiration Date:   11/03/2016     All questions were answered. The patient knows to call the clinic with any problems, questions or concerns. We can certainly see the patient much sooner if necessary.  This document serves as a record of services personally performed by Ancil Linsey, MD. It was created on her behalf by Arlyce Harman, a trained medical scribe. The creation of this record is based on the scribe's personal observations and the provider's statements to them. This document has been checked and approved by the attending provider.  I have reviewed the above documentation for accuracy and completeness and I agree with the above.  This note is electronically signed by Molli Hazard, MD  :11/04/2015 12:52 PM

## 2015-11-23 ENCOUNTER — Encounter (HOSPITAL_COMMUNITY): Payer: BLUE CROSS/BLUE SHIELD | Attending: Oncology

## 2015-11-23 DIAGNOSIS — Z452 Encounter for adjustment and management of vascular access device: Secondary | ICD-10-CM

## 2015-11-23 DIAGNOSIS — C50911 Malignant neoplasm of unspecified site of right female breast: Secondary | ICD-10-CM

## 2015-11-23 DIAGNOSIS — Z95828 Presence of other vascular implants and grafts: Secondary | ICD-10-CM | POA: Insufficient documentation

## 2015-11-23 MED ORDER — SODIUM CHLORIDE 0.9% FLUSH
10.0000 mL | INTRAVENOUS | Status: DC | PRN
  Administered 2015-11-23: 10 mL via INTRAVENOUS
  Filled 2015-11-23: qty 10

## 2015-11-23 MED ORDER — HEPARIN SOD (PORK) LOCK FLUSH 100 UNIT/ML IV SOLN
500.0000 [IU] | Freq: Once | INTRAVENOUS | Status: AC
  Administered 2015-11-23: 500 [IU] via INTRAVENOUS
  Filled 2015-11-23: qty 5

## 2015-11-23 NOTE — Progress Notes (Signed)
Rebecca Hensley presented for Portacath access and flush. Proper placement of portacath confirmed by CXR. Portacath located right chest wall accessed with  H 20 needle. Good blood return present. Portacath flushed with 59ml NS and 500U/91ml Heparin and needle removed intact. Procedure without incident. Patient tolerated procedure well.

## 2015-11-23 NOTE — Patient Instructions (Signed)
[employer] at [facility] Discharge Instructions  RECOMMENDATIONS MADE BY THE CONSULTANT AND ANY TEST RESULTS WILL BE SENT TO YOUR REFERRING PHYSICIAN.  Port flush today as scheduled.  Thank you for choosing [place] at [facility] to provide your oncology and hematology care.  To afford each patient quality time with our provider, please arrive at least 15 minutes before your scheduled appointment time.   Beginning January 23rd 2017 lab work for the [facility] will be done in the  Main lab at [facility] on 1st floor. If you have a lab appointment with the [facility] please come in thru the  Main Entrance and check in at the main information desk  You need to re-schedule your appointment should you arrive 10 or more minutes late.  We strive to give you quality time with our providers, and arriving late affects you and other patients whose appointments are after yours.  Also, if you no show three or more times for appointments you may be dismissed from the clinic at the providers discretion.     Again, thank you for choosing [facility].  Our hope is that these requests will decrease the amount of time that you wait before being seen by our physicians.       _____________________________________________________________  Should you have questions after your visit to [facility], please contact our office at [phone] between the hours of 8:30 a.m. and 4:30 p.m.  Voicemails left after 4:30 p.m. will not be returned until the following business day.  For prescription refill requests, have your pharmacy contact our office.         Resources For Cancer Patients and their Caregivers ? American Cancer Society: Can assist with transportation, wigs, general needs, runs Look Good Feel Better.        413-717-0360 ? Cancer Care: Provides financial assistance, online support groups, medication/co-pay assistance.   1-800-813-HOPE 812-874-0374) ? [place] Assists [place] cancer patients and their families through emotional , educational and financial support.  346-463-2036 ? [place] DSS Where to apply for food stamps, Medicaid and utility assistance. 201-112-6243 ? [facility]: Transportation to medical appointments. 575-604-1442 ? Social Security Administration: May apply for disability if have a Stage IV cancer. 3182305592 (251) 872-4597 ? [employer], Disability and Transit Services: Assists with nutrition, care and transit needs. [place] Support Programs: @10RELATIVEDAYS @ > Cancer Support Group  2nd Tuesday of the month 1pm-2pm, Journey Room  > Creative Journey  3rd Tuesday of the month 1130am-1pm, Journey Room  > Look Good Feel Better  1st Wednesday of the month 10am-12 noon, Journey Room (Call [place] to register [phone])

## 2015-11-29 ENCOUNTER — Encounter (HOSPITAL_COMMUNITY): Payer: Self-pay | Admitting: Hematology & Oncology

## 2015-12-07 ENCOUNTER — Encounter (HOSPITAL_COMMUNITY): Payer: Self-pay | Admitting: Hematology & Oncology

## 2015-12-07 ENCOUNTER — Other Ambulatory Visit (HOSPITAL_COMMUNITY): Payer: BLUE CROSS/BLUE SHIELD

## 2015-12-07 ENCOUNTER — Encounter (HOSPITAL_BASED_OUTPATIENT_CLINIC_OR_DEPARTMENT_OTHER): Payer: BLUE CROSS/BLUE SHIELD

## 2015-12-07 ENCOUNTER — Encounter (HOSPITAL_BASED_OUTPATIENT_CLINIC_OR_DEPARTMENT_OTHER): Payer: BLUE CROSS/BLUE SHIELD | Admitting: Hematology & Oncology

## 2015-12-07 VITALS — BP 109/64 | HR 88 | Temp 98.6°F | Resp 16 | Wt 167.9 lb

## 2015-12-07 DIAGNOSIS — Z452 Encounter for adjustment and management of vascular access device: Secondary | ICD-10-CM

## 2015-12-07 DIAGNOSIS — C50911 Malignant neoplasm of unspecified site of right female breast: Secondary | ICD-10-CM

## 2015-12-07 DIAGNOSIS — Z5111 Encounter for antineoplastic chemotherapy: Secondary | ICD-10-CM

## 2015-12-07 DIAGNOSIS — R5383 Other fatigue: Secondary | ICD-10-CM

## 2015-12-07 DIAGNOSIS — F32A Depression, unspecified: Secondary | ICD-10-CM

## 2015-12-07 DIAGNOSIS — R5382 Chronic fatigue, unspecified: Secondary | ICD-10-CM

## 2015-12-07 DIAGNOSIS — Z170 Estrogen receptor positive status [ER+]: Secondary | ICD-10-CM

## 2015-12-07 DIAGNOSIS — C7989 Secondary malignant neoplasm of other specified sites: Secondary | ICD-10-CM

## 2015-12-07 DIAGNOSIS — F329 Major depressive disorder, single episode, unspecified: Secondary | ICD-10-CM | POA: Diagnosis not present

## 2015-12-07 DIAGNOSIS — Z95828 Presence of other vascular implants and grafts: Secondary | ICD-10-CM | POA: Diagnosis present

## 2015-12-07 LAB — COMPREHENSIVE METABOLIC PANEL
ALBUMIN: 4 g/dL (ref 3.5–5.0)
ALT: 22 U/L (ref 14–54)
ANION GAP: 5 (ref 5–15)
AST: 18 U/L (ref 15–41)
Alkaline Phosphatase: 85 U/L (ref 38–126)
BUN: 13 mg/dL (ref 6–20)
CHLORIDE: 104 mmol/L (ref 101–111)
CO2: 28 mmol/L (ref 22–32)
Calcium: 8.9 mg/dL (ref 8.9–10.3)
Creatinine, Ser: 0.58 mg/dL (ref 0.44–1.00)
GFR calc Af Amer: >60 mL/min (ref >60)
GFR calc non Af Amer: >60 mL/min (ref >60)
GLUCOSE: 111 mg/dL — AB (ref 65–99)
POTASSIUM: 3.8 mmol/L (ref 3.5–5.1)
SODIUM: 137 mmol/L (ref 135–145)
TOTAL PROTEIN: 7 g/dL (ref 6.5–8.1)
Total Bilirubin: 0.5 mg/dL (ref 0.3–1.2)

## 2015-12-07 LAB — CBC WITH DIFFERENTIAL/PLATELET
BASOS ABS: 0 10*3/uL (ref 0.0–0.1)
BASOS PCT: 0 %
EOS ABS: 0 10*3/uL (ref 0.0–0.7)
Eosinophils Relative: 0 %
HCT: 36.6 % (ref 36.0–46.0)
Hemoglobin: 12.4 g/dL (ref 12.0–15.0)
Lymphocytes Relative: 22 %
Lymphs Abs: 0.7 10*3/uL (ref 0.7–4.0)
MCH: 30.6 pg (ref 26.0–34.0)
MCHC: 33.9 g/dL (ref 30.0–36.0)
MCV: 90.4 fL (ref 78.0–100.0)
MONO ABS: 0.3 10*3/uL (ref 0.1–1.0)
MONOS PCT: 9 %
NEUTROS ABS: 2.4 10*3/uL (ref 1.7–7.7)
NEUTROS PCT: 69 %
PLATELETS: 170 10*3/uL (ref 150–400)
RBC: 4.05 MIL/uL (ref 3.87–5.11)
RDW: 16 % — AB (ref 11.5–15.5)
WBC: 3.4 10*3/uL — ABNORMAL LOW (ref 4.0–10.5)

## 2015-12-07 MED ORDER — SODIUM CHLORIDE 0.9% FLUSH
10.0000 mL | Freq: Once | INTRAVENOUS | Status: AC
  Administered 2015-12-07: 10 mL via INTRAVENOUS

## 2015-12-07 MED ORDER — HEPARIN SOD (PORK) LOCK FLUSH 100 UNIT/ML IV SOLN
500.0000 [IU] | Freq: Once | INTRAVENOUS | Status: AC
  Administered 2015-12-07: 500 [IU] via INTRAVENOUS
  Filled 2015-12-07: qty 5

## 2015-12-07 NOTE — Progress Notes (Signed)
[facility] Hematology/Oncology Progress Note  Name: Rebecca Hensley      MRN: [id number]   Date: 12/07/2015 Time:10:50 AM   REFERRING PHYSICIAN:  Everardo All, MD (Medical Oncology)  REASON FOR CONSULT:  Transfer of medical oncology care   DIAGNOSIS:  Locally recurrent right chest wall breast cancer.    Chest wall recurrence of right breast cancer ([place])   06/27/2012 Initial Diagnosis    Breast cancer, invasive ductal carcinoma with lobular features      09/12/2012 Definitive Surgery    Right mastectomy with sentinel node biopsy      09/12/2012 Pathology Results    2.1 x 2.0 cm primary tumor, grade 2, ER 90%, PR NEGATIVE, HER2 NEGATIVE, ONCOTYPE score 22      01/29/2013 - 02/19/2013 Chemotherapy    TC x 1 cycle       02/18/2013 Adverse Reaction    Patient decided to stop chemotherapy after 1 cycle due to poor tolerance with side effects.      03/11/2015 Relapse/Recurrence    Positive biopsy from right chest wall nodule.      03/25/2015 PET scan    New hypermetabolic soft tissue nodule right anterior chest wall. Artifact versus small right hepatic metastatic lesion. Consider MRI for further evaluation      04/29/2015 - 06/10/2015 Chemotherapy    Dose-dense Adriamycin/Cytoxan x 4 cycles at [facility].  Last cycle requiring 25% dose reduction due to toxicities.      07/15/2015 Breast MRI    MRI breast- Slight interval decrease in size of biopsy proven recurrence in the right chest wall.No MRI evidence of malignancy in the left breast.      07/22/2015 Procedure    Right partial mastectomy by Dr. Carvel Getting ([place])       07/22/2015 Pathology Results    Right chest wall recurrent invasive ductal carcinoma with lobular features, intermediate grade ([id number]). ER+, PR +, HER2 NEGATIVE.      09/23/2015 Concurrent Chemotherapy    XRT with Xeloda 1250 mg/m2 days 1-14 every 21 days per [facility]: CREAT-X, NEJM 2017. Jun 1;376(22):2147-2159.   XRT beginning on 9/6        HISTORY OF PRESENT ILLNESS:   Rebecca Hensley is a 60 y.o. female with a medical history significant for breast cancer who returns today for ongoing follow-up of locally recurrent invasive ductal carcinoma with lobular features of right breast with recurrence to chest wall. She is followed at [facility], she was treated locally by Dr. Tressie Stalker, last seen on 06/17/2015. With the closure of [facility] she has transferred care here.  Ms. Weyandt returns to the [facility] unaccompanied. She is wearing a compression sleeve on her right forearm.   I have reviewed the labs with the patient.  She started back on her Xeloda today.  Her appetite is okay.   She has some tingling/discomfort in her legs that started about 2 years after her son's death. She has a hard time walking for a while. If she is grocery shopping, she has to stop and rest. She said it's not pain, she just has a hard time walking and standing.   She says that she works, takes a bath, and goes to bed.  Her sleeping is a little bit better. She doubled her medication and she can fall asleep now.  She is having a lot of anxiety. She is seeing a psychologist on December 1st. Sometimes she closes all of the blinds and doesn't want  to leave her house or talk to anyone. She notes that this has been an intermittent issue but has lately become much more frequent, occurring several times weekly.   She says that she has been experiencing some hot flashes, especially after she eats or if it is above 68 degrees in her house. She doesn't "bust out into a sweat" like she use to, but she gets very hot. Her sister has taken "naltrexone" for these, so she asks if that could be an option for her.   Review of Systems  Constitutional: Negative.        Appetite is okay.  Hot flashes.  HENT: Negative.   Eyes: Negative.   Respiratory: Negative.   Cardiovascular: Negative.   Gastrointestinal: Negative.   Genitourinary: Negative.   Musculoskeletal:  Negative.   Skin: Negative.   Neurological: Positive for tingling (legs).       Trouble standing or walking from tingling.   Endo/Heme/Allergies: Negative.   Psychiatric/Behavioral: Positive for depression. The patient is nervous/anxious and has insomnia (improved slightly).   All other systems reviewed and are negative. 14 point review of systems was performed and is negative except as detailed under history of present illness and above    PAST MEDICAL HISTORY:   Past Medical History:  Diagnosis Date  . Breast cancer ([place]) 2014  . Cancer ([place])   . Fuchs' corneal dystrophy   . Hyperlipidemia     ALLERGIES: Allergies  Allergen Reactions  . Codeine Nausea Only  . Gabapentin Other (See Comments)      MEDICATIONS: I have reviewed the patient's current medications.    Current Outpatient Prescriptions on File Prior to Visit  Medication Sig Dispense Refill  . acetaminophen (TYLENOL) 500 MG tablet Take by mouth.    . ALPRAZolam (XANAX) 0.5 MG tablet Take 1 tablet (0.5 mg total) by mouth 3 (three) times daily as needed for anxiety. 45 tablet 0  . Ascorbic Acid (VITAMIN C) 1000 MG tablet Take 1,000 mg by mouth daily.    . capecitabine (XELODA) 500 MG tablet Take 4 tablets (2,000 mg total) by mouth 2 (two) times daily after a meal. 56 tablet 1  . Cholecalciferol (VITAMIN D3) 1000 UNITS CAPS Take by mouth.    . Cholecalciferol (VITAMIN D3) 5000 units TABS Take by mouth.    . Cyanocobalamin (RA VITAMIN B-12 TR) 1000 MCG TBCR Take by mouth.    . eszopiclone (LUNESTA) 2 MG TABS tablet Take 2 mg by mouth at bedtime as needed for sleep. Take immediately before bedtime    . Flaxseed, Linseed, (FLAXSEED OIL) 1000 MG CAPS Take 1,000 mg by mouth daily.    . Iodine, Kelp, (KELP PO) Take 600 mg by mouth daily.    . montelukast (SINGULAIR) 10 MG tablet     . omeprazole (PRILOSEC) 40 MG capsule Take by mouth.    . ondansetron (ZOFRAN) 8 MG tablet Take 1 tablet (8 mg total) by mouth every 8 (eight)  hours as needed for nausea or vomiting. 30 tablet 2  . prochlorperazine (COMPAZINE) 10 MG tablet Take 1 tablet (10 mg total) by mouth every 6 (six) hours as needed for nausea or vomiting. 30 tablet 2  . temazepam (RESTORIL) 15 MG capsule Take by mouth.    . tobramycin-dexamethasone (TOBRADEX) ophthalmic solution Place one drop into both eyes daily.    Marland Kitchen venlafaxine XR (EFFEXOR-XR) 150 MG 24 hr capsule Take by mouth.    . vitamin A 8000 UNIT capsule Take 8,000 Units by  mouth daily.    . vitamin E 400 UNIT capsule Take 400 Units by mouth daily.     No current facility-administered medications on file prior to visit.      PAST SURGICAL HISTORY Past Surgical History:  Procedure Laterality Date  . APPENDECTOMY    . ENDOSCOPIC INSERTION PERITONEAL CATHETER PORT    . EXCISION MASS NECK    . NASAL SEPTUM SURGERY      FAMILY HISTORY: Family History  Problem Relation Age of Onset  . Breast cancer Mother 69    bilateral cancer, second diagnosis at 56  . Stomach cancer Father 56  . Breast cancer Sister 48    bilateral breast cancer, second dx at 22  . Breast cancer Maternal Aunt     dx in her 40s  . Prostate cancer Maternal Grandfather     dx in his 78s-60s  . Lung cancer Sister 38    smoker  . Breast cancer Maternal Aunt     dx in her 71s  . Breast cancer Cousin     maternal cousin  . Breast cancer Cousin     maternal cousin  . Breast cancer Paternal Aunt 45  . Bone cancer Paternal Uncle     thinks the cancer started somewhere else  . Breast cancer Paternal Aunt     dx in her 78s  . Cancer Cousin     dx in her 24s, a "female" cancer    SOCIAL HISTORY:  reports that she quit smoking about 13 years ago. She has a 30.00 pack-year smoking history. She has never used smokeless tobacco. She reports that she does not drink alcohol or use drugs.  Social History   Social History  . Marital status: Married    Spouse name: N/A  . Number of children: 2  . Years of education: N/A    Occupational History  .  [employer]   Social History Main Topics  . Smoking status: Former Smoker    Packs/day: 1.00    Years: 30.00    Quit date: 07/18/2002  . Smokeless tobacco: Never Used  . Alcohol use No  . Drug use: No  . Sexual activity: Not on file   Other Topics Concern  . Not on file   Social History Narrative  . No narrative on file    PERFORMANCE STATUS: The patient's performance status is 1 - Symptomatic but completely ambulatory  PHYSICAL EXAM: Most Recent Vital Signs: Blood pressure 109/64, pulse 88, temperature 98.6 F (37 C), temperature source Oral, resp. rate 16, weight 167 lb 14.4 oz (76.2 kg), SpO2 98 %.   Physical Exam  Constitutional: She is oriented to person, place, and time and well-developed, well-nourished, and in no distress.  Patient was able to get on exam table without assistance. Wears glasses. Compression sleeve on right arm.   HENT:  Head: Normocephalic and atraumatic.  Mouth/Throat: Oropharynx is clear and moist.  Eyes: Conjunctivae and EOM are normal. Pupils are equal, round, and reactive to light.  Neck: Normal range of motion. Neck supple.  Cardiovascular: Normal rate, regular rhythm and normal heart sounds.   Pulmonary/Chest: Effort normal and breath sounds normal.  Abdominal: Soft. Bowel sounds are normal. There is no tenderness.  Musculoskeletal: Normal range of motion.  Neurological: She is alert and oriented to person, place, and time. Gait normal.  Skin: Skin is warm and dry.  Nursing note and vitals reviewed.   LABORATORY DATA:  I have reviewed the laboratory  studies noted below: Results for REDONNA, WILBERT (MRN [id number]) as of 12/07/2015 15:14  Ref. Range 12/07/2015 11:22  Sodium Latest Ref Range: 135 - 145 mmol/L 137  Potassium Latest Ref Range: 3.5 - 5.1 mmol/L 3.8  Chloride Latest Ref Range: 101 - 111 mmol/L 104  CO2 Latest Ref Range: 22 - 32 mmol/L 28  BUN Latest Ref Range: 6 - 20 mg/dL 13   Creatinine Latest Ref Range: 0.44 - 1.00 mg/dL 6.93  Calcium Latest Ref Range: 8.9 - 10.3 mg/dL 8.9  EGFR (Non-African Amer.) Latest Ref Range: >60 mL/min >60  EGFR (African American) Latest Ref Range: >60 mL/min >60  Glucose Latest Ref Range: 65 - 99 mg/dL 529 (H)  Anion gap Latest Ref Range: 5 - 15  5  Alkaline Phosphatase Latest Ref Range: 38 - 126 U/L 85  Albumin Latest Ref Range: 3.5 - 5.0 g/dL 4.0  AST Latest Ref Range: 15 - 41 U/L 18  ALT Latest Ref Range: 14 - 54 U/L 22  Total Protein Latest Ref Range: 6.5 - 8.1 g/dL 7.0  Total Bilirubin Latest Ref Range: 0.3 - 1.2 mg/dL 0.5  WBC Latest Ref Range: 4.0 - 10.5 K/uL 3.4 (L)  RBC Latest Ref Range: 3.87 - 5.11 MIL/uL 4.05  Hemoglobin Latest Ref Range: 12.0 - 15.0 g/dL 25.9  HCT Latest Ref Range: 36.0 - 46.0 % 36.6  MCV Latest Ref Range: 78.0 - 100.0 fL 90.4  MCH Latest Ref Range: 26.0 - 34.0 pg 30.6  MCHC Latest Ref Range: 30.0 - 36.0 g/dL 18.2  RDW Latest Ref Range: 11.5 - 15.5 % 16.0 (H)  Platelets Latest Ref Range: 150 - 400 K/uL 170  Neutrophils Latest Units: % 69  Lymphocytes Latest Units: % 22  Monocytes Relative Latest Units: % 9  Eosinophil Latest Units: % 0  Basophil Latest Units: % 0  NEUT# Latest Ref Range: 1.7 - 7.7 K/uL 2.4  Lymphocyte # Latest Ref Range: 0.7 - 4.0 K/uL 0.7  Monocyte # Latest Ref Range: 0.1 - 1.0 K/uL 0.3  Eosinophils Absolute Latest Ref Range: 0.0 - 0.7 K/uL 0.0  Basophils Absolute Latest Ref Range: 0.0 - 0.1 K/uL 0.0       RADIOGRAPHY: I have personally reviewed the radiological images as listed and agreed with the findings in the report.   PATHOLOGY:    A.Chest wall, right (excision):  Intermediate-grade carcinoma involving prior surgical site, consistent with clinically recurrent invasive ductal carcinoma with lobular features, is identified infiltrating an area measuring 19 mm; skin is present and not involved by carcinoma.  Surgical resection margins are negative for invasive  carcinoma, with closest superior (by 0.5 mm) and inferior (by 2.5 mm) margins.   Breast cancer biomarker studies:Pending.  Paraffin block number:A7.  Results will be issued in a separate report from the Image Cytometry Laboratory.   B.Lymph nodes, right axillary (excision):  Multiple lymph nodes are negative for metastatic carcinoma (0 of 5).    Right chest wall containing intermediate-grade carcinoma, consistent with clinically recurrent invasive ductal carcinoma with lobular features([id number], Block A7; Cold Ischemia Time:2 hours, 41 minutes, Formalin Fixation Time:20 hours).   INTERPRETATION  ER INTERPRETATION: POSITIVE ESTROGEN RECEPTOR ACTIVITY (ALLRED SCORE = 8).  PR INTERPRETATION: POSITIVE PROGESTERONE RECEPTOR ACTIVITY (ALLRED SCORE = 4).   HER2/neu IMMUNOHISTOCHEMISTRY INTERPRETATION:NEGATIVE (0) FOR OVEREXPRESSION OF HER2/NEU ONCOPROTEIN.      ASSESSMENT/PLAN:  Breast cancer, right breast 2014 T1c, N0 R Mastectomy Oncotype DX score 22 TC X 1, declined endocrine therapy Right chest wall  recurrence, axillary tail ER+ PR+ HER 2 - AC X 4 Resection with LN dissection at [facility] Fatigue Depression  Recurrent right breast cancer.  - pT1c N0 (0/3) invasive ductal carcinoma of the RIGHT breast, ER+, PR+, HER2-, grade 2, diagnosed and treated in 2014. She underwent RIGHT mastectomy and SLND with Dr. Carvel Getting at that time. She was seen by medical oncology closer to home at that time. Oncotype DX was 22. Chemotherapy was recommended but she only took 1 cycle of TC before declining further therapy due to side effects. She also declined endocrine therapy at that time. - routine follow up on 02/24/2015 she was noted to have a nodule of the RIGHT chest wall or axilla.  - Ultrasound at the OSH on 03/04/2015 reportedly showed an irregular 1.2 cm mass, hypoechoic centrally and hyperechoic peripherally. Mass was superficial and abutting the chest wall musculature.  Several small axillary lymph nodes were also noted but did not appear to have suspicious morphology. Images have not been reviewed in-house.  OSH Bone scan 03/03/15 was negative for metastasis OSH PET scan -Negative OSH MRI Abd/pelvis- Negative Biopsy - 03/23/2015 pathology showed ER+, PR+, HER2- IDC completed 4 cycles of AC chemotherapy with local medical oncologist,  right partial mastectomy with LN dissection 07/22/15   She has seen Dr. Higinio Roger at [facility] on 09/03/2015.  Her recommendations follow: We reviewed Ms. Loomer's previous diagnosis and treatment with her in detail and reviewed staging scans ordered by her local oncologist. I discussed with her that I am happy to serve in a consultative mode for her. I advised that we will provide treatment recommendations to her local providers, however, we cannot prescribe or drive the treatment at another facility. We discussed the challenges of having care administered by two different medical oncologists. She was advised that she could return for treatment here if she chooses to do this.   I reviewed the multidisciplinary tumor board discussion frm 04/13/15. The overall recommendation was for neoadjuvant chemotherapy with [facility] given her poor tolerance to TC chemotherapy in the adjuvant setting.  I have recommended an echo for cardiac evaluation.  Would recommmend chemo teaching and start of AC as soon as echo could be done.  We would plan 4 cycles of AC and then reimage to assess for response. If she could proceed to surgery at that time would consider this versus further reduction in tumor burden with taxol chemotherapy to complete AC-T.   She completed AC x 4 locally with Dr.Eric Neijstrom. She had right partial mastectomy 07/22/15 with Dr.Greenup  Today we reviewed in detail her surgical pathology, which noted residual breast cancer. We discussed the option of further chemotherapy given lack of pathologic CR to [facility]. We discussed the option of  completing a more standard chemotherapy regimen by the addition of taxane therapy in the adjuvant setting. She is not inclined to do this. We also reviewed data regarding the addition of xeloda for non-path-CR patients, CREATE-X, NEJM, 2017. Jun 1;376(22):2147-2159. We discussed the option to proceed with radiation therapy combined with radiosensitizing agent Xeloda for better response. This would also be followed by endocrine therapy with Antihormone medication.   She states that she cannot return for f/u at [facility]. She is establishing care with another medical oncologist locally. We will forward recommendations to her local oncologists.  She will follow up with local medical oncologist to discuss plan.  She knows she also needs to follow up with radiaition oncology I would recommend adjuvant AI therapy for 5-10 years  after completion of RT.   We have reviewed the CREAT-X trial with the patient.  We have reviewed the risks, benefits, alternatives, and side effects of Xeloda treatment including, but not limited to, nausea/vomiting, fatigue, stomatitis, diarrhea, palmar-plantar erythrodysesthesia, anaphylaxis, and death.  Rx is printed for Xeloda 2000 mg BID days 1-14 every 21 days (in accordance with the CREAT-X trial).  She will take Xeloda for 6-8 cycles.  AI therapy will be initiated following completion of XRT after bone density exam is completed.  Labs reviewed with the patient in detail. Results are noted above.   She completed radiation treatment 10/12.  She continues on XELODA.   She needs a DEXA and to start AI therapy. WIll discuss at follow-up. I meant to talk with her today but she is scheduled to see a psychologist next week and I will let her start her therapy prior. I am going to see her back in 3 to 4 weeks.  I have given her Jennifer's number to call if she is having problems with anything.   She will return for a follow up as detailed.   All questions were answered. The patient  knows to call the clinic with any problems, questions or concerns. We can certainly see the patient much sooner if necessary.  This document serves as a record of services personally performed by Ancil Linsey, MD. It was created on her behalf by Martinique Casey, a trained medical scribe. The creation of this record is based on the scribe's personal observations and the provider's statements to them. This document has been checked and approved by the attending provider.  I have reviewed the above documentation for accuracy and completeness and I agree with the above.  This note is electronically signed by Molli Hazard, MD  :12/07/2015 10:50 AM

## 2015-12-07 NOTE — Patient Instructions (Signed)
[place] at [facility] Discharge Instructions  RECOMMENDATIONS MADE BY THE CONSULTANT AND ANY TEST RESULTS WILL BE SENT TO YOUR REFERRING PHYSICIAN.  Exam with Dr. Kiora Hallberg Muse. We will refill your Xeloda.   Return as scheduled.  Please see Amy as you leave today for your appointment dates and times.    Thank you for choosing [place] at [facility] to provide your oncology and hematology care.  To afford each patient quality time with our provider, please arrive at least 15 minutes before your scheduled appointment time.   Beginning January 23rd 2017 lab work for the [facility] will be done in the  Main lab at [facility] on 1st floor. If you have a lab appointment with the [facility] please come in thru the  Main Entrance and check in at the main information desk  You need to re-schedule your appointment should you arrive 10 or more minutes late.  We strive to give you quality time with our providers, and arriving late affects you and other patients whose appointments are after yours.  Also, if you no show three or more times for appointments you may be dismissed from the clinic at the providers discretion.     Again, thank you for choosing [facility].  Our hope is that these requests will decrease the amount of time that you wait before being seen by our physicians.       _____________________________________________________________  Should you have questions after your visit to [facility], please contact our office at [phone] between the hours of 8:30 a.m. and 4:30 p.m.  Voicemails left after 4:30 p.m. will not be returned until the following business day.  For prescription refill requests, have your pharmacy contact our office.         Resources For Cancer Patients and their Caregivers ? American Cancer Society: Can assist with transportation, wigs, general needs, runs Look Good Feel Better.         240 602 8422 ? Cancer Care: Provides financial assistance, online support groups, medication/co-pay assistance.  1-800-813-HOPE 7657609508) ? [place] Assists [place] cancer patients and their families through emotional , educational and financial support.  317-307-7600 ? [place] DSS Where to apply for food stamps, Medicaid and utility assistance. (859)393-0888 ? [facility]: Transportation to medical appointments. 289-546-6468 ? Social Security Administration: May apply for disability if have a Stage IV cancer. (346)633-5132 (815) 046-8378 ? [employer], Disability and Transit Services: Assists with nutrition, care and transit needs. [place] Support Programs: @10RELATIVEDAYS @ > Cancer Support Group  2nd Tuesday of the month 1pm-2pm, Journey Room  > Creative Journey  3rd Tuesday of the month 1130am-1pm, Journey Room  > Look Good Feel Better  1st Wednesday of the month 10am-12 noon, Journey Room (Call [place] to register [phone])

## 2015-12-07 NOTE — Progress Notes (Signed)
Rebecca Hensley presented for Portacath access and flush. Proper placement of portacath confirmed by CXR. Portacath located right chest wall accessed with  H 20 needle. Good blood return present. Portacath flushed with 28ml NS and 500U/60ml Heparin and needle removed intact. Procedure without incident. Patient tolerated procedure well.

## 2015-12-18 ENCOUNTER — Other Ambulatory Visit (HOSPITAL_COMMUNITY): Payer: Self-pay | Admitting: Emergency Medicine

## 2015-12-18 MED ORDER — CAPECITABINE 500 MG PO TABS: 2000.0000 mg | ORAL_TABLET | Freq: Two times a day (BID) | ORAL | 1 refills | Status: DC

## 2015-12-23 ENCOUNTER — Other Ambulatory Visit (HOSPITAL_COMMUNITY): Payer: Self-pay | Admitting: Oncology

## 2015-12-23 DIAGNOSIS — C50911 Malignant neoplasm of unspecified site of right female breast: Secondary | ICD-10-CM

## 2015-12-23 DIAGNOSIS — C7989 Secondary malignant neoplasm of other specified sites: Principal | ICD-10-CM

## 2015-12-23 MED ORDER — CAPECITABINE 500 MG PO TABS: 2000.0000 mg | ORAL_TABLET | Freq: Two times a day (BID) | ORAL | 0 refills | Status: DC

## 2015-12-24 ENCOUNTER — Telehealth (HOSPITAL_COMMUNITY): Payer: Self-pay | Admitting: Emergency Medicine

## 2015-12-24 ENCOUNTER — Other Ambulatory Visit (HOSPITAL_COMMUNITY): Payer: Self-pay | Admitting: Emergency Medicine

## 2015-12-24 NOTE — Telephone Encounter (Signed)
Called pt to let her know that she will have 8 cycles of Xeloda and we just filled her 6th cycle.  She verbalized understanding.

## 2015-12-29 ENCOUNTER — Telehealth (HOSPITAL_COMMUNITY): Payer: Self-pay | Admitting: Emergency Medicine

## 2015-12-29 NOTE — Telephone Encounter (Signed)
Pt called and stated that she still has not received her Xeloda.  Spoke with Janace Hoard it was faxed 12/24/2015 and she re-faxed it today as urgent.  Told pt to call [facility] and check on her prescription.  Gave her the phone number.  She verbalized understanding.

## 2015-12-29 NOTE — Telephone Encounter (Signed)
Verify prescription for [facility] for Xeloda.  112 tablets take days 1-14 of 21 days.

## 2016-01-07 ENCOUNTER — Other Ambulatory Visit (HOSPITAL_COMMUNITY): Payer: BLUE CROSS/BLUE SHIELD

## 2016-01-07 ENCOUNTER — Ambulatory Visit (HOSPITAL_COMMUNITY): Payer: BLUE CROSS/BLUE SHIELD | Admitting: Adult Health

## 2016-01-13 ENCOUNTER — Telehealth (HOSPITAL_COMMUNITY): Payer: Self-pay | Admitting: *Deleted

## 2016-01-14 ENCOUNTER — Telehealth (HOSPITAL_COMMUNITY): Payer: Self-pay | Admitting: *Deleted

## 2016-01-14 NOTE — Telephone Encounter (Signed)
Called and spoke with patient. She said she had decided to continue the Xeloda. She states she had a weak moment when she called and said she was going to stop. Instructed her to call for any concerns or needs. She verbalized understanding.

## 2016-01-20 ENCOUNTER — Telehealth (HOSPITAL_COMMUNITY): Payer: Self-pay | Admitting: Emergency Medicine

## 2016-01-20 NOTE — Telephone Encounter (Signed)
Pt called and wanted to know if it was really necessary for her to take her next cycle of Xeloda.  Spoke with Dr Whitney Muse.  Pt from trial needed to take 6-8 cycles of Xeloda.  Pt has taken 6 cycles of Xeloda from what Tom could count.  I told pt that she did not have to take the next cycle though we would rather her complete the whole 8 cycles.  She said ok because it made her feel so bad and it was $2,000.    She said she would finish the cycle that she was on and stop taking them.   She has a follow-up appt 02/17/2016.

## 2016-02-01 ENCOUNTER — Encounter (HOSPITAL_COMMUNITY): Payer: BLUE CROSS/BLUE SHIELD

## 2016-02-03 ENCOUNTER — Ambulatory Visit: Payer: BLUE CROSS/BLUE SHIELD | Admitting: Neurology

## 2016-02-17 ENCOUNTER — Encounter (HOSPITAL_COMMUNITY): Payer: BLUE CROSS/BLUE SHIELD | Attending: Oncology | Admitting: Oncology

## 2016-02-17 ENCOUNTER — Encounter (HOSPITAL_COMMUNITY): Payer: Self-pay | Admitting: Lab

## 2016-02-17 ENCOUNTER — Encounter (HOSPITAL_BASED_OUTPATIENT_CLINIC_OR_DEPARTMENT_OTHER): Payer: BLUE CROSS/BLUE SHIELD

## 2016-02-17 ENCOUNTER — Encounter (HOSPITAL_COMMUNITY): Payer: Self-pay

## 2016-02-17 VITALS — BP 108/68 | HR 89 | Temp 97.9°F | Resp 16 | Wt 172.2 lb

## 2016-02-17 DIAGNOSIS — C50911 Malignant neoplasm of unspecified site of right female breast: Secondary | ICD-10-CM | POA: Insufficient documentation

## 2016-02-17 DIAGNOSIS — C7989 Secondary malignant neoplasm of other specified sites: Secondary | ICD-10-CM | POA: Diagnosis not present

## 2016-02-17 DIAGNOSIS — Z452 Encounter for adjustment and management of vascular access device: Secondary | ICD-10-CM | POA: Diagnosis not present

## 2016-02-17 DIAGNOSIS — F329 Major depressive disorder, single episode, unspecified: Secondary | ICD-10-CM | POA: Insufficient documentation

## 2016-02-17 DIAGNOSIS — F32A Depression, unspecified: Secondary | ICD-10-CM | POA: Insufficient documentation

## 2016-02-17 DIAGNOSIS — Z95828 Presence of other vascular implants and grafts: Secondary | ICD-10-CM

## 2016-02-17 DIAGNOSIS — Z170 Estrogen receptor positive status [ER+]: Secondary | ICD-10-CM | POA: Diagnosis not present

## 2016-02-17 MED ORDER — HEPARIN SOD (PORK) LOCK FLUSH 100 UNIT/ML IV SOLN
500.0000 [IU] | Freq: Once | INTRAVENOUS | Status: AC
  Administered 2016-02-17: 500 [IU] via INTRAVENOUS
  Filled 2016-02-17: qty 5

## 2016-02-17 MED ORDER — SODIUM CHLORIDE 0.9% FLUSH
10.0000 mL | Freq: Once | INTRAVENOUS | Status: AC
  Administered 2016-02-17: 10 mL via INTRAVENOUS

## 2016-02-17 NOTE — Progress Notes (Signed)
[facility] Hematology/Oncology Progress Note  Name: Rebecca Hensley      MRN: [id number]   Date: 02/17/2016 Time:10:24 AM   REFERRING PHYSICIAN:  Everardo All, MD (Medical Oncology)  REASON FOR CONSULT:  Transfer of medical oncology care   DIAGNOSIS:  Locally recurrent right chest wall breast cancer.    Chest wall recurrence of right breast cancer ([place])   06/27/2012 Initial Diagnosis    Breast cancer, invasive ductal carcinoma with lobular features      09/12/2012 Definitive Surgery    Right mastectomy with sentinel node biopsy      09/12/2012 Pathology Results    2.1 x 2.0 cm primary tumor, grade 2, ER 90%, PR NEGATIVE, HER2 NEGATIVE, ONCOTYPE score 22      01/29/2013 - 02/19/2013 Chemotherapy    TC x 1 cycle       02/18/2013 Adverse Reaction    Patient decided to stop chemotherapy after 1 cycle due to poor tolerance with side effects.      03/11/2015 Relapse/Recurrence    Positive biopsy from right chest wall nodule.      03/25/2015 PET scan    New hypermetabolic soft tissue nodule right anterior chest wall. Artifact versus small right hepatic metastatic lesion. Consider MRI for further evaluation      04/29/2015 - 06/10/2015 Chemotherapy    Dose-dense Adriamycin/Cytoxan x 4 cycles at [facility].  Last cycle requiring 25% dose reduction due to toxicities.      07/15/2015 Breast MRI    MRI breast- Slight interval decrease in size of biopsy proven recurrence in the right chest wall.No MRI evidence of malignancy in the left breast.      07/22/2015 Procedure    Right partial mastectomy by Dr. Carvel Getting ([place])       07/22/2015 Pathology Results    Right chest wall recurrent invasive ductal carcinoma with lobular features, intermediate grade ([id number]). ER+, PR +, HER2 NEGATIVE.      09/23/2015 Concurrent Chemotherapy    XRT with Xeloda 1250 mg/m2 days 1-14 every 21 days per [facility]: CREAT-X, NEJM 2017. Jun 1;376(22):2147-2159.   XRT beginning on 9/6-  10/29/2015       HISTORY OF PRESENT ILLNESS:   Rebecca Hensley is a 61 y.o. female with a medical history significant for breast cancer who returns today for ongoing follow-up of locally recurrent invasive ductal carcinoma with lobular features of right breast with recurrence to chest wall. Rebecca Hensley is followed at [facility], Rebecca Hensley was treated locally by Dr. Tressie Stalker, last seen on 06/17/2015. With the closure of [facility] Rebecca Hensley has transferred care here.  Rebecca Hensley returns to the [facility] unaccompanied and for a follow up.   Rebecca Hensley did 6 cycles of Xeloda. Rebecca Hensley finished taking it January 21, 2016. Rebecca Hensley was fatigued and had a hard time sleeping. Rebecca Hensley was also experiencing depression and anxiety. Rebecca Hensley takes Xanax and sleeping medication, without relief. Pt still has some hot flashes. Rebecca Hensley has some leg and back pain that has been present since 2003. Rebecca Hensley can not stand for very long. Denies nausea, vomiting, chest pain, SOB, loss of appetite, diarrhea, constipation, abdominal pain, or any other concerns today.   Review of Systems  Constitutional: Positive for malaise/fatigue.       Hot flashes  HENT: Negative.   Eyes: Negative.   Respiratory: Negative.  Negative for shortness of breath.   Cardiovascular: Negative.  Negative for chest pain.  Gastrointestinal: Negative.  Negative for abdominal pain, constipation, diarrhea,  nausea and vomiting.  Genitourinary: Negative.   Musculoskeletal: Positive for back pain and joint pain (leg).  Skin: Negative.   Neurological: Negative.   Endo/Heme/Allergies: Negative.   Psychiatric/Behavioral: Positive for depression. The patient is nervous/anxious and has insomnia.   All other systems reviewed and are negative. 14 point review of systems was performed and is negative except as detailed under history of present illness and above  PAST MEDICAL HISTORY:   Past Medical History:  Diagnosis Date  . Breast cancer ([place]) 2014  . Cancer ([place])   . Fuchs' corneal dystrophy   .  Hyperlipidemia     ALLERGIES: Allergies  Allergen Reactions  . Codeine Nausea Only  . Gabapentin Other (See Comments)      MEDICATIONS: I have reviewed the patient's current medications.    Current Outpatient Prescriptions on File Prior to Visit  Medication Sig Dispense Refill  . acetaminophen (TYLENOL) 500 MG tablet Take by mouth.    . ALPRAZolam (XANAX) 0.5 MG tablet Take 1 tablet (0.5 mg total) by mouth 3 (three) times daily as needed for anxiety. 45 tablet 0  . Ascorbic Acid (VITAMIN C) 1000 MG tablet Take 1,000 mg by mouth daily.    . capecitabine (XELODA) 500 MG tablet Take 4 tablets (2,000 mg total) by mouth 2 (two) times daily after a meal. 56 tablet 0  . Cholecalciferol (VITAMIN D3) 1000 UNITS CAPS Take by mouth.    . Cholecalciferol (VITAMIN D3) 5000 units TABS Take by mouth.    . Cyanocobalamin (RA VITAMIN B-12 TR) 1000 MCG TBCR Take by mouth.    . eszopiclone (LUNESTA) 2 MG TABS tablet Take 2 mg by mouth at bedtime as needed for sleep. Take immediately before bedtime    . Flaxseed, Linseed, (FLAXSEED OIL) 1000 MG CAPS Take 1,000 mg by mouth daily.    . Iodine, Kelp, (KELP PO) Take 600 mg by mouth daily.    . montelukast (SINGULAIR) 10 MG tablet     . omeprazole (PRILOSEC) 40 MG capsule Take by mouth.    . ondansetron (ZOFRAN) 8 MG tablet Take 1 tablet (8 mg total) by mouth every 8 (eight) hours as needed for nausea or vomiting. 30 tablet 2  . prochlorperazine (COMPAZINE) 10 MG tablet Take 1 tablet (10 mg total) by mouth every 6 (six) hours as needed for nausea or vomiting. 30 tablet 2  . temazepam (RESTORIL) 15 MG capsule Take by mouth.    . tobramycin-dexamethasone (TOBRADEX) ophthalmic solution Place one drop into both eyes daily.    Marland Kitchen venlafaxine XR (EFFEXOR-XR) 150 MG 24 hr capsule Take by mouth.    . vitamin A 8000 UNIT capsule Take 8,000 Units by mouth daily.    . vitamin E 400 UNIT capsule Take 400 Units by mouth daily.     No current facility-administered  medications on file prior to visit.      PAST SURGICAL HISTORY Past Surgical History:  Procedure Laterality Date  . APPENDECTOMY    . ENDOSCOPIC INSERTION PERITONEAL CATHETER PORT    . EXCISION MASS NECK    . NASAL SEPTUM SURGERY      FAMILY HISTORY: Family History  Problem Relation Age of Onset  . Breast cancer Mother 53    bilateral cancer, second diagnosis at 34  . Stomach cancer Father 28  . Breast cancer Sister 11    bilateral breast cancer, second dx at 52  . Breast cancer Maternal Aunt     dx in her 59s  .  Prostate cancer Maternal Grandfather     dx in his 49s-60s  . Lung cancer Sister 63    smoker  . Breast cancer Maternal Aunt     dx in her 14s  . Breast cancer Cousin     maternal cousin  . Breast cancer Cousin     maternal cousin  . Breast cancer Paternal Aunt 24  . Bone cancer Paternal Uncle     thinks the cancer started somewhere else  . Breast cancer Paternal Aunt     dx in her 55s  . Cancer Cousin     dx in her 73s, a "female" cancer    SOCIAL HISTORY:  reports that Rebecca Hensley quit smoking about 13 years ago. Rebecca Hensley has a 30.00 pack-year smoking history. Rebecca Hensley has never used smokeless tobacco. Rebecca Hensley reports that Rebecca Hensley does not drink alcohol or use drugs.  Social History   Social History  . Marital status: Married    Spouse name: N/A  . Number of children: 2  . Years of education: N/A   Occupational History  .  [employer]   Social History Main Topics  . Smoking status: Former Smoker    Packs/day: 1.00    Years: 30.00    Quit date: 07/18/2002  . Smokeless tobacco: Never Used  . Alcohol use No  . Drug use: No  . Sexual activity: Not on file   Other Topics Concern  . Not on file   Social History Narrative  . No narrative on file    PERFORMANCE STATUS: The patient's performance status is 1 - Symptomatic but completely ambulatory  PHYSICAL EXAM: Most Recent Vital Signs: Blood pressure 108/68, pulse 89, temperature 97.9 F (36.6 C),  temperature source Oral, resp. rate 16, weight 172 lb 3.2 oz (78.1 kg), SpO2 99 %.   Physical Exam  Constitutional: Rebecca Hensley is oriented to person, place, and time and well-developed, well-nourished, and in no distress.  Patient was able to get on exam table without assistance. Wears glasses.  HENT:  Head: Normocephalic and atraumatic.  Mouth/Throat: Oropharynx is clear and moist.  Eyes: Conjunctivae and EOM are normal. Pupils are equal, round, and reactive to light.  Neck: Normal range of motion. Neck supple.  Cardiovascular: Normal rate, regular rhythm and normal heart sounds.   Pulmonary/Chest: Effort normal and breath sounds normal.  Abdominal: Soft. Bowel sounds are normal. There is no tenderness.  Musculoskeletal: Normal range of motion.  Neurological: Rebecca Hensley is alert and oriented to person, place, and time. Gait normal.  Skin: Skin is warm and dry.  Nursing note and vitals reviewed.   LABORATORY DATA:  I have reviewed the laboratory studies noted below: Results for LURAE, HORNBROOK (MRN [id number]) as of 12/07/2015 15:14  Ref. Range 12/07/2015 11:22  Sodium Latest Ref Range: 135 - 145 mmol/L 137  Potassium Latest Ref Range: 3.5 - 5.1 mmol/L 3.8  Chloride Latest Ref Range: 101 - 111 mmol/L 104  CO2 Latest Ref Range: 22 - 32 mmol/L 28  BUN Latest Ref Range: 6 - 20 mg/dL 13  Creatinine Latest Ref Range: 0.44 - 1.00 mg/dL 0.58  Calcium Latest Ref Range: 8.9 - 10.3 mg/dL 8.9  EGFR (Non-African Amer.) Latest Ref Range: >60 mL/min >60  EGFR (African American) Latest Ref Range: >60 mL/min >60  Glucose Latest Ref Range: 65 - 99 mg/dL 111 (H)  Anion gap Latest Ref Range: 5 - 15  5  Alkaline Phosphatase Latest Ref Range: 38 - 126 U/L 85  Albumin Latest  Ref Range: 3.5 - 5.0 g/dL 4.0  AST Latest Ref Range: 15 - 41 U/L 18  ALT Latest Ref Range: 14 - 54 U/L 22  Total Protein Latest Ref Range: 6.5 - 8.1 g/dL 7.0  Total Bilirubin Latest Ref Range: 0.3 - 1.2 mg/dL 0.5  WBC Latest Ref Range: 4.0  - 10.5 K/uL 3.4 (L)  RBC Latest Ref Range: 3.87 - 5.11 MIL/uL 4.05  Hemoglobin Latest Ref Range: 12.0 - 15.0 g/dL 12.4  HCT Latest Ref Range: 36.0 - 46.0 % 36.6  MCV Latest Ref Range: 78.0 - 100.0 fL 90.4  MCH Latest Ref Range: 26.0 - 34.0 pg 30.6  MCHC Latest Ref Range: 30.0 - 36.0 g/dL 33.9  RDW Latest Ref Range: 11.5 - 15.5 % 16.0 (H)  Platelets Latest Ref Range: 150 - 400 K/uL 170  Neutrophils Latest Units: % 69  Lymphocytes Latest Units: % 22  Monocytes Relative Latest Units: % 9  Eosinophil Latest Units: % 0  Basophil Latest Units: % 0  NEUT# Latest Ref Range: 1.7 - 7.7 K/uL 2.4  Lymphocyte # Latest Ref Range: 0.7 - 4.0 K/uL 0.7  Monocyte # Latest Ref Range: 0.1 - 1.0 K/uL 0.3  Eosinophils Absolute Latest Ref Range: 0.0 - 0.7 K/uL 0.0  Basophils Absolute Latest Ref Range: 0.0 - 0.1 K/uL 0.0    RADIOGRAPHY: I have personally reviewed the radiological images as listed and agreed with the findings in the report.   PATHOLOGY:      ASSESSMENT/PLAN:  Breast cancer, right breast 2014 T1c, N0 R Mastectomy Oncotype DX score 22 TC X 1, declined endocrine therapy Right chest wall recurrence, axillary tail ER+ PR+ HER 2 - AC X 4 Resection with LN dissection at [facility] Fatigue Depression  Recurrent right breast cancer.  - pT1c N0 (0/3) invasive ductal carcinoma of the RIGHT breast, ER+, PR+, HER2-, grade 2, diagnosed and treated in 2014. Rebecca Hensley underwent RIGHT mastectomy and SLND with Dr. Carvel Getting at that time. Rebecca Hensley was seen by medical oncology closer to home at that time. Oncotype DX was 22. Chemotherapy was recommended but Rebecca Hensley only took 1 cycle of TC before declining further therapy due to side effects. Rebecca Hensley also declined endocrine therapy at that time. - routine follow up on 02/24/2015 Rebecca Hensley was noted to have a nodule of the RIGHT chest wall or axilla.  - Ultrasound at the OSH on 03/04/2015 reportedly showed an irregular 1.2 cm mass, hypoechoic centrally and hyperechoic peripherally. Mass  was superficial and abutting the chest wall musculature. Several small axillary lymph nodes were also noted but did not appear to have suspicious morphology. Images have not been reviewed in-house.  OSH Bone scan 03/03/15 was negative for metastasis OSH PET scan -Negative OSH MRI Abd/pelvis- Negative Biopsy - 03/23/2015 pathology showed ER+, PR+, HER2- IDC completed 4 cycles of AC chemotherapy with local medical oncologist,  right partial mastectomy with LN dissection 07/22/15   Rebecca Hensley has seen Dr. Higinio Roger at [facility] on 09/03/2015.  Her recommendations follow: We reviewed Rebecca Hensley's previous diagnosis and treatment with her in detail and reviewed staging scans ordered by her local oncologist. I discussed with her that I am happy to serve in a consultative mode for her. I advised that we will provide treatment recommendations to her local providers, however, we cannot prescribe or drive the treatment at another facility. We discussed the challenges of having care administered by two different medical oncologists. Rebecca Hensley was advised that Rebecca Hensley could return for treatment here if Rebecca Hensley chooses to  do this.   I reviewed the multidisciplinary tumor board discussion frm 04/13/15. The overall recommendation was for neoadjuvant chemotherapy with [facility] given her poor tolerance to TC chemotherapy in the adjuvant setting.  I have recommended an echo for cardiac evaluation.  Would recommmend chemo teaching and start of AC as soon as echo could be done.  We would plan 4 cycles of AC and then reimage to assess for response. If Rebecca Hensley could proceed to surgery at that time would consider this versus further reduction in tumor burden with taxol chemotherapy to complete AC-T.   Rebecca Hensley completed AC x 4 locally with Dr.Eric Neijstrom. Rebecca Hensley had right partial mastectomy 07/22/15 with Dr.Greenup  Today we reviewed in detail her surgical pathology, which noted residual breast cancer. We discussed the option of further chemotherapy given lack  of pathologic CR to [facility]. We discussed the option of completing a more standard chemotherapy regimen by the addition of taxane therapy in the adjuvant setting. Rebecca Hensley is not inclined to do this. We also reviewed data regarding the addition of xeloda for non-path-CR patients, CREATE-X, NEJM, 2017. Jun 1;376(22):2147-2159. We discussed the option to proceed with radiation therapy combined with radiosensitizing agent Xeloda for better response. This would also be followed by endocrine therapy with Antihormone medication.   Rebecca Hensley states that Rebecca Hensley cannot return for f/u at [facility]. Rebecca Hensley is establishing care with another medical oncologist locally. We will forward recommendations to her local oncologists.  Rebecca Hensley will follow up with local medical oncologist to discuss plan.  Rebecca Hensley knows Rebecca Hensley also needs to follow up with radiaition oncology I would recommend adjuvant AI therapy for 5-10 years after completion of RT.   We have reviewed the CREAT-X trial with the patient.  We have reviewed the risks, benefits, alternatives, and side effects of Xeloda treatment including, but not limited to, nausea/vomiting, fatigue, stomatitis, diarrhea, palmar-plantar erythrodysesthesia, anaphylaxis, and death.  Rx is printed for Xeloda 2000 mg BID days 1-14 every 21 days (in accordance with the CREAT-X trial).  Rebecca Hensley will take Xeloda for 6-8 cycles.  AI therapy will be initiated following completion of XRT after bone density exam is completed.  Patient has completed 6 cycles of xeloda on 01/21/16 and does not want to take the last 2 cycles due to adverse side effects.   I have ordered a DEXA scan. If Rebecca Hensley has signs of osteopenia, we can start her on Prolia.   We discussed an aromatase inhibitor and its possible side effects in detail, however patient states Rebecca Hensley would like to think about it, and may pursue holistic remedies instead. Rebecca Hensley will tell me what Rebecca Hensley decides. Rebecca Hensley will call me.   I will schedule her for port removal with Dr. Tye Maryland in [place]. Ok  from my standpoint to get her port removed.   Schedule tentatively for follow up in 1 month.   All questions were answered. The patient knows to call the clinic with any problems, questions or concerns. We can certainly see the patient much sooner if necessary.  This document serves as a record of services personally performed by Twana First, MD. It was created on her behalf by Martinique Casey, a trained medical scribe. The creation of this record is based on the scribe's personal observations and the provider's statements to them. This document has been checked and approved by the attending provider.  I have reviewed the above documentation for accuracy and completeness and I agree with the above.  This note is electronically signed by Martinique M Casey  :  02/17/2016 10:24 AM

## 2016-02-17 NOTE — Progress Notes (Signed)
Rebecca Hensley presented for Portacath access and flush. Proper placement of portacath confirmed by CXR. Portacath located right chest wall accessed with  H 20 needle. Good blood return present. Portacath flushed with 16ml NS and 500U/37ml Heparin and needle removed intact. Procedure without incident. Patient tolerated procedure well.

## 2016-02-17 NOTE — Patient Instructions (Signed)
[place] at [facility] Discharge Instructions  RECOMMENDATIONS MADE BY THE CONSULTANT AND ANY TEST RESULTS WILL BE SENT TO YOUR REFERRING PHYSICIAN.  You were seen today by Dr. Barron Schmid We will get you scheduled for Dexa Scan Follow up in 4 weeks We have also given you some information on Arimidex.  Thank you for choosing [place] at [facility] to provide your oncology and hematology care.  To afford each patient quality time with our provider, please arrive at least 15 minutes before your scheduled appointment time.    If you have a lab appointment with the [facility] please come in thru the  Main Entrance and check in at the main information desk  You need to re-schedule your appointment should you arrive 10 or more minutes late.  We strive to give you quality time with our providers, and arriving late affects you and other patients whose appointments are after yours.  Also, if you no show three or more times for appointments you may be dismissed from the clinic at the providers discretion.     Again, thank you for choosing [facility].  Our hope is that these requests will decrease the amount of time that you wait before being seen by our physicians.       _____________________________________________________________  Should you have questions after your visit to [facility], please contact our office at [phone] between the hours of 8:30 a.m. and 4:30 p.m.  Voicemails left after 4:30 p.m. will not be returned until the following business day.  For prescription refill requests, have your pharmacy contact our office.       Resources For Cancer Patients and their Caregivers ? American Cancer Society: Can assist with transportation, wigs, general needs, runs Look Good Feel Better.        225-486-9813 ? Cancer Care: Provides financial assistance, online support groups, medication/co-pay assistance.   1-800-813-HOPE 731-014-0982) ? [place] Assists [place] cancer patients and their families through emotional , educational and financial support.  (254) 107-6548 ? [place] DSS Where to apply for food stamps, Medicaid and utility assistance. 272-795-2273 ? [facility]: Transportation to medical appointments. (463)020-9916 ? Social Security Administration: May apply for disability if have a Stage IV cancer. (989) 696-5088 250-596-9834 ? [employer], Disability and Transit Services: Assists with nutrition, care and transit needs. [place] Support Programs: @10RELATIVEDAYS @ > Cancer Support Group  2nd Tuesday of the month 1pm-2pm, Journey Room  > Creative Journey  3rd Tuesday of the month 1130am-1pm, Journey Room  > Look Good Feel Better  1st Wednesday of the month 10am-12 noon, Journey Room (Call [place] to register [phone])

## 2016-02-17 NOTE — Progress Notes (Unsigned)
Referral to Dr Tye Maryland for port removal.  They will call patient with appt.

## 2016-03-03 ENCOUNTER — Ambulatory Visit: Payer: BLUE CROSS/BLUE SHIELD | Admitting: Neurology

## 2016-03-16 ENCOUNTER — Ambulatory Visit (HOSPITAL_COMMUNITY): Payer: BLUE CROSS/BLUE SHIELD

## 2016-03-18 ENCOUNTER — Other Ambulatory Visit: Payer: Self-pay | Admitting: Obstetrics & Gynecology

## 2016-03-18 DIAGNOSIS — Z853 Personal history of malignant neoplasm of breast: Secondary | ICD-10-CM

## 2016-03-22 ENCOUNTER — Ambulatory Visit
Admission: RE | Admit: 2016-03-22 | Discharge: 2016-03-22 | Disposition: A | Discharge status: Home / Self Care | Payer: BLUE CROSS/BLUE SHIELD | Source: Ambulatory Visit | Attending: Obstetrics & Gynecology | Admitting: Obstetrics & Gynecology

## 2016-03-22 ENCOUNTER — Other Ambulatory Visit: Payer: Self-pay | Admitting: Obstetrics & Gynecology

## 2016-03-22 ENCOUNTER — Encounter: Payer: Self-pay | Admitting: Radiology

## 2016-03-22 DIAGNOSIS — Z9011 Acquired absence of right breast and nipple: Secondary | ICD-10-CM

## 2016-03-22 DIAGNOSIS — Z853 Personal history of malignant neoplasm of breast: Secondary | ICD-10-CM

## 2016-03-22 HISTORY — DX: Personal history of irradiation: Z92.3

## 2016-03-22 HISTORY — DX: Personal history of antineoplastic chemotherapy: Z92.21

## 2016-04-13 ENCOUNTER — Ambulatory Visit (HOSPITAL_COMMUNITY): Payer: BLUE CROSS/BLUE SHIELD

## 2016-04-13 MED ORDER — HEPARIN SOD (PORK) LOCK FLUSH 100 UNIT/ML IV SOLN: 500.0000 [IU] | Freq: Once | INTRAVENOUS | Status: AC

## 2016-04-13 MED ORDER — SODIUM CHLORIDE 0.9% FLUSH: 20.0000 mL | INTRAVENOUS | Status: AC | PRN

## 2016-04-26 ENCOUNTER — Encounter (HOSPITAL_COMMUNITY): Payer: BLUE CROSS/BLUE SHIELD | Attending: Oncology

## 2016-04-26 DIAGNOSIS — Z95828 Presence of other vascular implants and grafts: Secondary | ICD-10-CM | POA: Insufficient documentation

## 2016-04-26 DIAGNOSIS — C50911 Malignant neoplasm of unspecified site of right female breast: Secondary | ICD-10-CM | POA: Insufficient documentation

## 2016-08-17 ENCOUNTER — Other Ambulatory Visit (HOSPITAL_COMMUNITY): Payer: Self-pay | Admitting: Emergency Medicine

## 2016-08-19 ENCOUNTER — Ambulatory Visit (HOSPITAL_COMMUNITY): Payer: BLUE CROSS/BLUE SHIELD

## 2016-08-19 ENCOUNTER — Encounter (HOSPITAL_COMMUNITY): Payer: BLUE CROSS/BLUE SHIELD | Attending: Oncology | Admitting: Oncology

## 2016-08-19 ENCOUNTER — Encounter (HOSPITAL_COMMUNITY): Payer: Self-pay

## 2016-08-19 VITALS — BP 120/74 | HR 86 | Resp 16 | Ht 64.0 in | Wt 187.5 lb

## 2016-08-19 DIAGNOSIS — Z9011 Acquired absence of right breast and nipple: Secondary | ICD-10-CM | POA: Diagnosis not present

## 2016-08-19 DIAGNOSIS — C50911 Malignant neoplasm of unspecified site of right female breast: Secondary | ICD-10-CM

## 2016-08-19 DIAGNOSIS — Z853 Personal history of malignant neoplasm of breast: Secondary | ICD-10-CM | POA: Diagnosis not present

## 2016-08-19 DIAGNOSIS — Z170 Estrogen receptor positive status [ER+]: Secondary | ICD-10-CM

## 2016-08-19 DIAGNOSIS — Z9221 Personal history of antineoplastic chemotherapy: Secondary | ICD-10-CM | POA: Diagnosis not present

## 2016-08-19 NOTE — Progress Notes (Signed)
[facility] Hematology/Oncology Progress Note  Name: Rebecca Hensley      MRN: [id number]   Date: 08/19/2016 Time:11:49 AM   REFERRING PHYSICIAN:  Everardo All, MD (Medical Oncology)  REASON FOR CONSULT:  Transfer of medical oncology care   DIAGNOSIS:  Locally recurrent right chest wall breast cancer.    Recurrent breast cancer, right ([place])   06/27/2012 Initial Diagnosis    Breast cancer, invasive ductal carcinoma with lobular features      09/12/2012 Definitive Surgery    Right mastectomy with sentinel node biopsy      09/12/2012 Pathology Results    2.1 x 2.0 cm primary tumor, grade 2, ER 90%, PR NEGATIVE, HER2 NEGATIVE, ONCOTYPE score 22      01/29/2013 - 02/19/2013 Chemotherapy    TC x 1 cycle       02/18/2013 Adverse Reaction    Patient decided to stop chemotherapy after 1 cycle due to poor tolerance with side effects.      03/11/2015 Relapse/Recurrence    Positive biopsy from right chest wall nodule.      03/25/2015 PET scan    New hypermetabolic soft tissue nodule right anterior chest wall. Artifact versus small right hepatic metastatic lesion. Consider MRI for further evaluation      04/29/2015 - 06/10/2015 Chemotherapy    Dose-dense Adriamycin/Cytoxan x 4 cycles at [facility].  Last cycle requiring 25% dose reduction due to toxicities.      07/15/2015 Breast MRI    MRI breast- Slight interval decrease in size of biopsy proven recurrence in the right chest wall.No MRI evidence of malignancy in the left breast.      07/22/2015 Procedure    Right partial mastectomy by Dr. Carvel Getting ([place])       07/22/2015 Pathology Results    Right chest wall recurrent invasive ductal carcinoma with lobular features, intermediate grade ([id number]). ER+, PR +, HER2 NEGATIVE.      09/23/2015 Concurrent Chemotherapy    XRT with Xeloda 1250 mg/m2 days 1-14 every 21 days per [facility]: CREAT-X, NEJM 2017. Jun 1;376(22):2147-2159.   XRT beginning on 9/6- 10/29/2015 Completed  cycle 6 of Xeloda on 01/21/16       HISTORY OF PRESENT ILLNESS:   Rebecca Hensley is a 61 y.o. female with a medical history significant for breast cancer who returns today for ongoing follow-up of locally recurrent invasive ductal carcinoma with lobular features of right breast with recurrence to chest wall. She is followed at [facility], she was treated locally by Dr. Tressie Stalker, last seen on 06/17/2015. With the closure of [facility] she has transferred care here.  Rebecca Hensley returns to the [facility] unaccompanied and for a follow up.  Since her last visit she has decided to start holistic treatment with pellets that are injected q4 months which is supposed to suppress her estrogen. She has opted to not take aromatase inhibitor. She had a left diagnostic mammogram and [place] on 03/22/16 which was negative for malignancy.  She has not felt any new subcutaneous nodules on her left chest wall or any masses in her right breast. She complains of a 20 lb weight gain since her last visit.   Review of Systems  Constitutional: Negative for malaise/fatigue.       Weight gain  HENT: Negative.   Eyes: Negative.   Respiratory: Negative.  Negative for shortness of breath.   Cardiovascular: Negative.  Negative for chest pain.  Gastrointestinal: Negative.  Negative for abdominal  pain, constipation, diarrhea, nausea and vomiting.  Genitourinary: Negative.   Musculoskeletal: Negative for back pain and joint pain.  Skin: Negative.   Neurological: Negative.   Endo/Heme/Allergies: Negative.   Psychiatric/Behavioral: Negative for depression. The patient is not nervous/anxious and does not have insomnia.   All other systems reviewed and are negative. 14 point review of systems was performed and is negative except as detailed under history of present illness and above  PAST MEDICAL HISTORY:   Past Medical History:  Diagnosis Date  . Breast cancer ([place]) 2014  . Cancer ([place])   . Fuchs' corneal dystrophy   .  Hyperlipidemia   . Personal history of chemotherapy   . Personal history of radiation therapy     ALLERGIES: Allergies  Allergen Reactions  . Codeine Nausea Only  . Gabapentin Other (See Comments)      MEDICATIONS: I have reviewed the patient's current medications.    Current Outpatient Prescriptions on File Prior to Visit  Medication Sig Dispense Refill  . acetaminophen (TYLENOL) 500 MG tablet Take by mouth.    . ALPRAZolam (XANAX) 0.5 MG tablet Take 1 tablet (0.5 mg total) by mouth 3 (three) times daily as needed for anxiety. 45 tablet 0  . Ascorbic Acid (VITAMIN C) 1000 MG tablet Take 1,000 mg by mouth daily.    . Cholecalciferol (VITAMIN D3) 5000 units TABS Take by mouth.    . Cyanocobalamin (RA VITAMIN B-12 TR) 1000 MCG TBCR Take by mouth.    . eszopiclone (LUNESTA) 2 MG TABS tablet Take 2 mg by mouth at bedtime as needed for sleep. Take immediately before bedtime    . Flaxseed, Linseed, (FLAXSEED OIL) 1000 MG CAPS Take 1,000 mg by mouth daily.    . Iodine, Kelp, (KELP PO) Take 600 mg by mouth daily.    Marland Kitchen liothyronine (CYTOMEL) 25 MCG tablet     . mirtazapine (REMERON) 30 MG tablet     . montelukast (SINGULAIR) 10 MG tablet     . temazepam (RESTORIL) 30 MG capsule     . venlafaxine XR (EFFEXOR-XR) 150 MG 24 hr capsule Take by mouth.    . vitamin A 8000 UNIT capsule Take 8,000 Units by mouth daily.    . vitamin E 400 UNIT capsule Take 400 Units by mouth daily.     Current Facility-Administered Medications on File Prior to Visit  Medication Dose Route Frequency Provider Last Rate Last Dose  . heparin lock flush 100 unit/mL  500 Units Intravenous Once Kefalas, Thomas S, PA-C      . sodium chloride flush (NS) 0.9 % injection 20 mL  20 mL Intravenous PRN Baird Cancer, PA-C         PAST SURGICAL HISTORY Past Surgical History:  Procedure Laterality Date  . APPENDECTOMY    . ENDOSCOPIC INSERTION PERITONEAL CATHETER PORT    . EXCISION MASS NECK    . MASTECTOMY Right 2015    . NASAL SEPTUM SURGERY      FAMILY HISTORY: Family History  Problem Relation Age of Onset  . Breast cancer Mother 68       bilateral cancer, second diagnosis at 4  . Stomach cancer Father 19  . Breast cancer Sister 72       bilateral breast cancer, second dx at 51  . Breast cancer Maternal Aunt        dx in her 64s  . Prostate cancer Maternal Grandfather        dx in his 35s-60s  .  Lung cancer Sister 44       smoker  . Breast cancer Maternal Aunt        dx in her 74s  . Breast cancer Cousin        maternal cousin  . Breast cancer Cousin        maternal cousin  . Breast cancer Paternal Aunt 8  . Bone cancer Paternal Uncle        thinks the cancer started somewhere else  . Breast cancer Paternal Aunt        dx in her 20s  . Cancer Cousin        dx in her 27s, a "female" cancer    SOCIAL HISTORY:  reports that she quit smoking about 14 years ago. She has a 30.00 pack-year smoking history. She has never used smokeless tobacco. She reports that she does not drink alcohol or use drugs.  Social History   Social History  . Marital status: Married    Spouse name: N/A  . Number of children: 2  . Years of education: N/A   Occupational History  .  [employer]   Social History Main Topics  . Smoking status: Former Smoker    Packs/day: 1.00    Years: 30.00    Quit date: 07/18/2002  . Smokeless tobacco: Never Used  . Alcohol use No  . Drug use: No  . Sexual activity: Not Asked   Other Topics Concern  . None   Social History Narrative  . None    PERFORMANCE STATUS: The patient's performance status is 1 - Symptomatic but completely ambulatory  PHYSICAL EXAM: Most Recent Vital Signs: Blood pressure 120/74, pulse 86, resp. rate 16, height '5\' 4"'$  (1.626 m), weight 187 lb 8 oz (85 kg), SpO2 96 %.   Physical Exam  Constitutional: She is oriented to person, place, and time and well-developed, well-nourished, and in no distress.  Patient was able to get on  exam table without assistance. Wears glasses.  HENT:  Head: Normocephalic and atraumatic.  Mouth/Throat: Oropharynx is clear and moist.  Eyes: Pupils are equal, round, and reactive to light. Conjunctivae and EOM are normal.  Neck: Normal range of motion. Neck supple.  Cardiovascular: Normal rate, regular rhythm and normal heart sounds.   Pulmonary/Chest: Effort normal and breath sounds normal.  Abdominal: Soft. Bowel sounds are normal. There is no tenderness.  Musculoskeletal: Normal range of motion.  Neurological: She is alert and oriented to person, place, and time. Gait normal.  Skin: Skin is warm and dry.  Nursing note and vitals reviewed. BREAST: Left breast without any masses, nipple inversion, skin changes, or axillary lymphadenopathy. Right chest wall mastectomy, no subcutaneous nodules, axillary lymphadenopathy or skin changes.  LABORATORY DATA:  CBC    Component Value Date/Time   WBC 3.4 (L) 12/07/2015 1122   RBC 4.05 12/07/2015 1122   HGB 12.4 12/07/2015 1122   HGB 12.1 11/20/2012 1509   HCT 36.6 12/07/2015 1122   HCT 36.6 11/20/2012 1509   PLT 170 12/07/2015 1122   PLT 267 11/20/2012 1509   MCV 90.4 12/07/2015 1122   MCV 85.6 11/20/2012 1509   MCH 30.6 12/07/2015 1122   MCHC 33.9 12/07/2015 1122   RDW 16.0 (H) 12/07/2015 1122   RDW 13.5 11/20/2012 1509   LYMPHSABS 0.7 12/07/2015 1122   LYMPHSABS 1.6 11/20/2012 1509   MONOABS 0.3 12/07/2015 1122   MONOABS 0.5 11/20/2012 1509   EOSABS 0.0 12/07/2015 1122   EOSABS 0.1 11/20/2012  1509   BASOSABS 0.0 12/07/2015 1122   BASOSABS 0.0 11/20/2012 1509      RADIOGRAPHY: I have personally reviewed the radiological images as listed and agreed with the findings in the report.   PATHOLOGY:      ASSESSMENT/PLAN:  Breast cancer, right breast 2014 T1c, N0 R Mastectomy Oncotype DX score 22 TC X 1, declined endocrine therapy Right chest wall recurrence, axillary tail ER+ PR+ HER 2 - AC X 4 Resection with LN  dissection at [facility] Fatigue Depression  Recurrent right breast cancer.  - pT1c N0 (0/3) invasive ductal carcinoma of the RIGHT breast, ER+, PR+, HER2-, grade 2, diagnosed and treated in 2014. She underwent RIGHT mastectomy and SLND with Dr. Carvel Getting at that time. She was seen by medical oncology closer to home at that time. Oncotype DX was 22. Chemotherapy was recommended but she only took 1 cycle of TC before declining further therapy due to side effects. She also declined endocrine therapy at that time. - routine follow up on 02/24/2015 she was noted to have a nodule of the RIGHT chest wall or axilla.  - Ultrasound at the OSH on 03/04/2015 reportedly showed an irregular 1.2 cm mass, hypoechoic centrally and hyperechoic peripherally. Mass was superficial and abutting the chest wall musculature. Several small axillary lymph nodes were also noted but did not appear to have suspicious morphology. Images have not been reviewed in-house.  OSH Bone scan 03/03/15 was negative for metastasis OSH PET scan -Negative OSH MRI Abd/pelvis- Negative Biopsy - 03/23/2015 pathology showed ER+, PR+, HER2- IDC completed 4 cycles of AC chemotherapy with local medical oncologist,  right partial mastectomy with LN dissection 07/22/15   She has seen Dr. Higinio Roger at [facility] on 09/03/2015.  Her recommendations follow: We reviewed Ms. Lederman's previous diagnosis and treatment with her in detail and reviewed staging scans ordered by her local oncologist. I discussed with her that I am happy to serve in a consultative mode for her. I advised that we will provide treatment recommendations to her local providers, however, we cannot prescribe or drive the treatment at another facility. We discussed the challenges of having care administered by two different medical oncologists. She was advised that she could return for treatment here if she chooses to do this.   I reviewed the multidisciplinary tumor board discussion frm 04/13/15. The  overall recommendation was for neoadjuvant chemotherapy with [facility] given her poor tolerance to TC chemotherapy in the adjuvant setting.  I have recommended an echo for cardiac evaluation.  Would recommmend chemo teaching and start of AC as soon as echo could be done.  We would plan 4 cycles of AC and then reimage to assess for response. If she could proceed to surgery at that time would consider this versus further reduction in tumor burden with taxol chemotherapy to complete AC-T.   She completed AC x 4 locally with Dr.Eric Neijstrom. She had right partial mastectomy 07/22/15 with Dr.Greenup  Today we reviewed in detail her surgical pathology, which noted residual breast cancer. We discussed the option of further chemotherapy given lack of pathologic CR to [facility]. We discussed the option of completing a more standard chemotherapy regimen by the addition of taxane therapy in the adjuvant setting. She is not inclined to do this. We also reviewed data regarding the addition of xeloda for non-path-CR patients, CREATE-X, NEJM, 2017. Jun 1;376(22):2147-2159. We discussed the option to proceed with radiation therapy combined with radiosensitizing agent Xeloda for better response. This would also be followed  by endocrine therapy with Antihormone medication.   She states that she cannot return for f/u at [facility]. She is establishing care with another medical oncologist locally. We will forward recommendations to her local oncologists.  She will follow up with local medical oncologist to discuss plan.  She knows she also needs to follow up with radiaition oncology I would recommend adjuvant AI therapy for 5-10 years after completion of RT.   We have reviewed the CREAT-X trial with the patient.  We have reviewed the risks, benefits, alternatives, and side effects of Xeloda treatment including, but not limited to, nausea/vomiting, fatigue, stomatitis, diarrhea, palmar-plantar erythrodysesthesia, anaphylaxis, and death.  Rx  is printed for Xeloda 2000 mg BID days 1-14 every 21 days (in accordance with the CREAT-X trial).  She will take Xeloda for 6-8 cycles.  AI therapy will be initiated following completion of XRT after bone density exam is completed.  Patient has completed 6 cycles of xeloda on 01/21/16 and does not want to take the last 2 cycles due to adverse side effects.    PLAN: Clinically NED on exam today.  Patient has opted to undergo holistic therapy to suppress her estrogen, instead of taking aromatase inhibitor.  Reschedule DEXA scan since she missed her appointment.  I will schedule her for port removal with Dr. Tye Maryland in [place]. Ok from my standpoint to get her port removed.   RTC in 3 months for follow up and exam.  All questions were answered. The patient knows to call the clinic with any problems, questions or concerns. We can certainly see the patient much sooner if necessary.    This note is electronically signed by Twana First, MD  :08/19/2016 11:49 AM

## 2016-08-22 ENCOUNTER — Encounter (HOSPITAL_COMMUNITY): Payer: Self-pay | Admitting: Adult Health

## 2016-08-22 NOTE — Progress Notes (Unsigned)
Patient scheduled to see Dr. Ladona Horns at [facility] on 09/06/16 @ 8:45 (8:15 arrival) for port removal consult.

## 2016-08-24 ENCOUNTER — Other Ambulatory Visit (HOSPITAL_COMMUNITY): Payer: BLUE CROSS/BLUE SHIELD

## 2016-08-24 ENCOUNTER — Encounter (HOSPITAL_COMMUNITY): Payer: Self-pay

## 2016-09-01 ENCOUNTER — Ambulatory Visit (HOSPITAL_COMMUNITY)
Admission: RE | Admit: 2016-09-01 | Discharge: 2016-09-01 | Disposition: A | Discharge status: Home / Self Care | Payer: BLUE CROSS/BLUE SHIELD | Source: Ambulatory Visit | Attending: Oncology | Admitting: Oncology

## 2016-09-01 DIAGNOSIS — M8588 Other specified disorders of bone density and structure, other site: Secondary | ICD-10-CM | POA: Diagnosis not present

## 2016-09-01 DIAGNOSIS — C50911 Malignant neoplasm of unspecified site of right female breast: Secondary | ICD-10-CM | POA: Diagnosis present

## 2016-10-05 ENCOUNTER — Other Ambulatory Visit (HOSPITAL_COMMUNITY): Payer: Self-pay

## 2016-10-05 DIAGNOSIS — Z95828 Presence of other vascular implants and grafts: Secondary | ICD-10-CM

## 2016-11-21 ENCOUNTER — Encounter (HOSPITAL_COMMUNITY): Payer: Self-pay | Admitting: Oncology

## 2016-11-21 ENCOUNTER — Encounter (HOSPITAL_COMMUNITY): Payer: BLUE CROSS/BLUE SHIELD | Attending: Oncology | Admitting: Oncology

## 2016-11-21 VITALS — BP 133/72 | HR 93 | Temp 98.4°F | Resp 16 | Ht 64.0 in | Wt 190.0 lb

## 2016-11-21 DIAGNOSIS — Z9011 Acquired absence of right breast and nipple: Secondary | ICD-10-CM

## 2016-11-21 DIAGNOSIS — Z853 Personal history of malignant neoplasm of breast: Secondary | ICD-10-CM | POA: Diagnosis not present

## 2016-11-21 DIAGNOSIS — Z9221 Personal history of antineoplastic chemotherapy: Secondary | ICD-10-CM

## 2016-11-21 DIAGNOSIS — Z170 Estrogen receptor positive status [ER+]: Secondary | ICD-10-CM | POA: Diagnosis not present

## 2016-11-21 DIAGNOSIS — C50911 Malignant neoplasm of unspecified site of right female breast: Secondary | ICD-10-CM | POA: Insufficient documentation

## 2016-11-21 NOTE — Progress Notes (Signed)
[facility] Hematology/Oncology Progress Note  Name: JIL PENLAND      MRN: [id number]   Date: 11/21/2016 Time:2:43 PM   REFERRING PHYSICIAN:  Everardo All, MD (Medical Oncology)  REASON FOR CONSULT:  Transfer of medical oncology care   DIAGNOSIS:  Locally recurrent right chest wall breast cancer.    Recurrent breast cancer, right ([place])   06/27/2012 Initial Diagnosis    Breast cancer, invasive ductal carcinoma with lobular features      09/12/2012 Definitive Surgery    Right mastectomy with sentinel node biopsy      09/12/2012 Pathology Results    2.1 x 2.0 cm primary tumor, grade 2, ER 90%, PR NEGATIVE, HER2 NEGATIVE, ONCOTYPE score 22      01/29/2013 - 02/19/2013 Chemotherapy    TC x 1 cycle       02/18/2013 Adverse Reaction    Patient decided to stop chemotherapy after 1 cycle due to poor tolerance with side effects.      03/11/2015 Relapse/Recurrence    Positive biopsy from right chest wall nodule.      03/25/2015 PET scan    New hypermetabolic soft tissue nodule right anterior chest wall. Artifact versus small right hepatic metastatic lesion. Consider MRI for further evaluation      04/29/2015 - 06/10/2015 Chemotherapy    Dose-dense Adriamycin/Cytoxan x 4 cycles at [facility].  Last cycle requiring 25% dose reduction due to toxicities.      07/15/2015 Breast MRI    MRI breast- Slight interval decrease in size of biopsy proven recurrence in the right chest wall.No MRI evidence of malignancy in the left breast.      07/22/2015 Procedure    Right partial mastectomy by Dr. Carvel Getting ([place])       07/22/2015 Pathology Results    Right chest wall recurrent invasive ductal carcinoma with lobular features, intermediate grade ([id number]). ER+, PR +, HER2 NEGATIVE.      09/23/2015 Concurrent Chemotherapy    XRT with Xeloda 1250 mg/m2 days 1-14 every 21 days per [facility]: CREAT-X, NEJM 2017. Jun 1;376(22):2147-2159.   XRT beginning on 9/6- 10/29/2015 Completed  cycle 6 of Xeloda on 01/21/16       HISTORY OF PRESENT ILLNESS:   Rebecca Hensley is a 61 y.o. female with a medical history significant for breast cancer who returns today for ongoing follow-up of locally recurrent invasive ductal carcinoma with lobular features of right breast with recurrence to chest wall. She is followed at [facility], she was treated locally by Dr. Tressie Stalker, last seen on 06/17/2015. With the closure of [facility] she has transferred care here.  Patient presents today for continue follow-up.  Overall she states she has been doing well without any new health issues.  She continues to take medication through a holistic approach to suppress her estrogen.  She has not felt any new breast masses in her left breast or nodules on her left chest wall.  She had a bone density performed on 09/01/2016 which showed osteopenia.  She is currently not on a calcium vitamin D supplement.   Review of Systems  Constitutional: Negative for malaise/fatigue.  HENT: Negative.   Eyes: Negative.   Respiratory: Negative.  Negative for shortness of breath.   Cardiovascular: Negative.  Negative for chest pain.  Gastrointestinal: Negative.  Negative for abdominal pain, constipation, diarrhea, nausea and vomiting.  Genitourinary: Negative.   Musculoskeletal: Negative for back pain and joint pain.  Skin: Negative.   Neurological: Negative.  Endo/Heme/Allergies: Negative.   Psychiatric/Behavioral: Negative for depression. The patient is not nervous/anxious and does not have insomnia.   All other systems reviewed and are negative. 14 point review of systems was performed and is negative except as detailed under history of present illness and above  PAST MEDICAL HISTORY:   Past Medical History:  Diagnosis Date  . Breast cancer ([place]) 2014  . Cancer ([place])   . Fuchs' corneal dystrophy   . Hyperlipidemia   . Personal history of chemotherapy   . Personal history of radiation therapy     ALLERGIES: Allergies    Allergen Reactions  . Codeine Nausea Only  . Gabapentin Other (See Comments)      MEDICATIONS: I have reviewed the patient's current medications.    Current Outpatient Medications on File Prior to Visit  Medication Sig Dispense Refill  . ALPRAZolam (XANAX) 0.5 MG tablet Take 1 tablet (0.5 mg total) by mouth 3 (three) times daily as needed for anxiety. 45 tablet 0  . Ascorbic Acid (VITAMIN C) 1000 MG tablet Take 1,000 mg by mouth daily.    . Cholecalciferol (VITAMIN D3) 5000 units TABS Take by mouth.    . Flaxseed, Linseed, (FLAXSEED OIL) 1000 MG CAPS Take 1,000 mg by mouth daily.    . Iodine, Kelp, (KELP PO) Take 600 mg by mouth daily.    . mirtazapine (REMERON) 30 MG tablet     . montelukast (SINGULAIR) 10 MG tablet     . NON FORMULARY Testosterone '100mg'$ , Anastrozole '4mg'$ , Stearic acid 5.'5mg'$  Pellet    . temazepam (RESTORIL) 30 MG capsule     . venlafaxine XR (EFFEXOR-XR) 150 MG 24 hr capsule Take by mouth.    . vitamin A 8000 UNIT capsule Take 8,000 Units by mouth daily.    . vitamin E 400 UNIT capsule Take 400 Units by mouth daily.     Current Facility-Administered Medications on File Prior to Visit  Medication Dose Route Frequency Provider Last Rate Last Dose  . heparin lock flush 100 unit/mL  500 Units Intravenous Once Kefalas, Thomas S, PA-C      . sodium chloride flush (NS) 0.9 % injection 20 mL  20 mL Intravenous PRN Baird Cancer, PA-C         PAST SURGICAL HISTORY Past Surgical History:  Procedure Laterality Date  . APPENDECTOMY    . ENDOSCOPIC INSERTION PERITONEAL CATHETER PORT    . EXCISION MASS NECK    . MASTECTOMY Right 2015  . NASAL SEPTUM SURGERY      FAMILY HISTORY: Family History  Problem Relation Age of Onset  . Breast cancer Mother 41       bilateral cancer, second diagnosis at 67  . Stomach cancer Father 59  . Breast cancer Sister 33       bilateral breast cancer, second dx at 67  . Breast cancer Maternal Aunt        dx in her 73s  . Prostate  cancer Maternal Grandfather        dx in his 55s-60s  . Lung cancer Sister 49       smoker  . Breast cancer Maternal Aunt        dx in her 29s  . Breast cancer Cousin        maternal cousin  . Breast cancer Cousin        maternal cousin  . Breast cancer Paternal Aunt 9  . Bone cancer Paternal Uncle  thinks the cancer started somewhere else  . Breast cancer Paternal Aunt        dx in her 84s  . Cancer Cousin        dx in her 68s, a "female" cancer    SOCIAL HISTORY:  reports that she quit smoking about 14 years ago. She has a 30.00 pack-year smoking history. she has never used smokeless tobacco. She reports that she does not drink alcohol or use drugs.  Social History   Socioeconomic History  . Marital status: Married    Spouse name: None  . Number of children: 2  . Years of education: None  . Highest education level: None  Social Needs  . Financial resource strain: None  . Food insecurity - worry: None  . Food insecurity - inability: None  . Transportation needs - medical: None  . Transportation needs - non-medical: None  Occupational History    Employer: [employer]  Tobacco Use  . Smoking status: Former Smoker    Packs/day: 1.00    Years: 30.00    Pack years: 30.00    Last attempt to quit: 07/18/2002    Years since quitting: 14.3  . Smokeless tobacco: Never Used  Substance and Sexual Activity  . Alcohol use: No  . Drug use: No  . Sexual activity: None  Other Topics Concern  . None  Social History Narrative  . None    PERFORMANCE STATUS: The patient's performance status is 1 - Symptomatic but completely ambulatory  PHYSICAL EXAM: Most Recent Vital Signs: Blood pressure 133/72, pulse 93, temperature 98.4 F (36.9 C), temperature source Oral, resp. rate 16, height '5\' 4"'$  (1.626 m), weight 190 lb (86.2 kg), SpO2 96 %.   Physical Exam  Constitutional: She is oriented to person, place, and time and well-developed, well-nourished, and in no  distress.  Patient was able to get on exam table without assistance. Wears glasses.  HENT:  Head: Normocephalic and atraumatic.  Mouth/Throat: Oropharynx is clear and moist.  Eyes: Conjunctivae and EOM are normal. Pupils are equal, round, and reactive to light.  Neck: Normal range of motion. Neck supple.  Cardiovascular: Normal rate, regular rhythm and normal heart sounds.  Pulmonary/Chest: Effort normal and breath sounds normal.  Abdominal: Soft. Bowel sounds are normal. There is no tenderness.  Musculoskeletal: Normal range of motion.  Neurological: She is alert and oriented to person, place, and time. Gait normal.  Skin: Skin is warm and dry.  Nursing note and vitals reviewed. BREAST: Left breast without any masses, nipple inversion, skin changes, or axillary lymphadenopathy. Right chest wall mastectomy, no subcutaneous nodules, axillary lymphadenopathy or skin changes.  LABORATORY DATA:  CBC    Component Value Date/Time   WBC 3.4 (L) 12/07/2015 1122   RBC 4.05 12/07/2015 1122   HGB 12.4 12/07/2015 1122   HGB 12.1 11/20/2012 1509   HCT 36.6 12/07/2015 1122   HCT 36.6 11/20/2012 1509   PLT 170 12/07/2015 1122   PLT 267 11/20/2012 1509   MCV 90.4 12/07/2015 1122   MCV 85.6 11/20/2012 1509   MCH 30.6 12/07/2015 1122   MCHC 33.9 12/07/2015 1122   RDW 16.0 (H) 12/07/2015 1122   RDW 13.5 11/20/2012 1509   LYMPHSABS 0.7 12/07/2015 1122   LYMPHSABS 1.6 11/20/2012 1509   MONOABS 0.3 12/07/2015 1122   MONOABS 0.5 11/20/2012 1509   EOSABS 0.0 12/07/2015 1122   EOSABS 0.1 11/20/2012 1509   BASOSABS 0.0 12/07/2015 1122   BASOSABS 0.0 11/20/2012 1509  RADIOGRAPHY: I have personally reviewed the radiological images as listed and agreed with the findings in the report.   PATHOLOGY:      ASSESSMENT/PLAN:  Breast cancer, right breast 2014 T1c, N0 R Mastectomy Oncotype DX score 22 TC X 1, declined endocrine therapy Right chest wall recurrence, axillary tail ER+ PR+ HER  2 - AC X 4 Resection with LN dissection at [facility] Fatigue Depression  Recurrent right breast cancer.  - pT1c N0 (0/3) invasive ductal carcinoma of the RIGHT breast, ER+, PR+, HER2-, grade 2, diagnosed and treated in 2014. She underwent RIGHT mastectomy and SLND with Dr. Carvel Getting at that time. She was seen by medical oncology closer to home at that time. Oncotype DX was 22. Chemotherapy was recommended but she only took 1 cycle of TC before declining further therapy due to side effects. She also declined endocrine therapy at that time. - routine follow up on 02/24/2015 she was noted to have a nodule of the RIGHT chest wall or axilla.  - Ultrasound at the OSH on 03/04/2015 reportedly showed an irregular 1.2 cm mass, hypoechoic centrally and hyperechoic peripherally. Mass was superficial and abutting the chest wall musculature. Several small axillary lymph nodes were also noted but did not appear to have suspicious morphology. Images have not been reviewed in-house.  OSH Bone scan 03/03/15 was negative for metastasis OSH PET scan -Negative OSH MRI Abd/pelvis- Negative Biopsy - 03/23/2015 pathology showed ER+, PR+, HER2- IDC completed 4 cycles of AC chemotherapy with local medical oncologist,  right partial mastectomy with LN dissection 07/22/15   She has seen Dr. Higinio Roger at [facility] on 09/03/2015.  Her recommendations follow: We reviewed Ms. Egerton's previous diagnosis and treatment with her in detail and reviewed staging scans ordered by her local oncologist. I discussed with her that I am happy to serve in a consultative mode for her. I advised that we will provide treatment recommendations to her local providers, however, we cannot prescribe or drive the treatment at another facility. We discussed the challenges of having care administered by two different medical oncologists. She was advised that she could return for treatment here if she chooses to do this.   I reviewed the multidisciplinary tumor  board discussion frm 04/13/15. The overall recommendation was for neoadjuvant chemotherapy with [facility] given her poor tolerance to TC chemotherapy in the adjuvant setting.  I have recommended an echo for cardiac evaluation.  Would recommmend chemo teaching and start of AC as soon as echo could be done.  We would plan 4 cycles of AC and then reimage to assess for response. If she could proceed to surgery at that time would consider this versus further reduction in tumor burden with taxol chemotherapy to complete AC-T.   She completed AC x 4 locally with Dr.Eric Neijstrom. She had right partial mastectomy 07/22/15 with Dr.Greenup  Today we reviewed in detail her surgical pathology, which noted residual breast cancer. We discussed the option of further chemotherapy given lack of pathologic CR to [facility]. We discussed the option of completing a more standard chemotherapy regimen by the addition of taxane therapy in the adjuvant setting. She is not inclined to do this. We also reviewed data regarding the addition of xeloda for non-path-CR patients, CREATE-X, NEJM, 2017. Jun 1;376(22):2147-2159. We discussed the option to proceed with radiation therapy combined with radiosensitizing agent Xeloda for better response. This would also be followed by endocrine therapy with Antihormone medication.   She states that she cannot return for f/u at [facility].  She is establishing care with another medical oncologist locally. We will forward recommendations to her local oncologists.  She will follow up with local medical oncologist to discuss plan.  She knows she also needs to follow up with radiaition oncology I would recommend adjuvant AI therapy for 5-10 years after completion of RT.   We have reviewed the CREAT-X trial with the patient.  We have reviewed the risks, benefits, alternatives, and side effects of Xeloda treatment including, but not limited to, nausea/vomiting, fatigue, stomatitis, diarrhea, palmar-plantar  erythrodysesthesia, anaphylaxis, and death.  Rx is printed for Xeloda 2000 mg BID days 1-14 every 21 days (in accordance with the CREAT-X trial).  She will take Xeloda for 6-8 cycles.  AI therapy will be initiated following completion of XRT after bone density exam is completed.  Patient has completed 6 cycles of xeloda on 01/21/16 and does not want to take the last 2 cycles due to adverse side effects.    PLAN: Clinically NED on exam today.  Patient has opted to undergo holistic therapy to suppress her estrogen, instead of taking aromatase inhibitor.  Reviewed her DEXA scan in detail with her today. Advised her to take calcium vitamin d with at least 1200 mg of calcium daily. Patient states she will get an OTC calcium-vitamin D supplement.  Ordered repeat annual left diagnostic mammogram for March 2019.  Mastectomy bra with prosthesis refilled.   RTC in 4 months for follow up and exam.  All questions were answered. The patient knows to call the clinic with any problems, questions or concerns. We can certainly see the patient much sooner if necessary.    This note is electronically signed by Twana First, MD  :11/21/2016 2:43 PM

## 2017-03-28 ENCOUNTER — Encounter (HOSPITAL_COMMUNITY): Payer: BLUE CROSS/BLUE SHIELD

## 2017-03-28 ENCOUNTER — Other Ambulatory Visit: Payer: Self-pay | Admitting: Oncology

## 2017-03-28 ENCOUNTER — Other Ambulatory Visit (HOSPITAL_COMMUNITY): Payer: Self-pay | Admitting: Internal Medicine

## 2017-03-28 DIAGNOSIS — Z1231 Encounter for screening mammogram for malignant neoplasm of breast: Secondary | ICD-10-CM

## 2017-03-29 ENCOUNTER — Ambulatory Visit (HOSPITAL_COMMUNITY)
Admission: RE | Admit: 2017-03-29 | Discharge: 2017-03-29 | Disposition: A | Discharge status: Home / Self Care | Payer: BLUE CROSS/BLUE SHIELD | Source: Ambulatory Visit | Attending: Adult Health | Admitting: Adult Health

## 2017-03-29 ENCOUNTER — Ambulatory Visit (HOSPITAL_COMMUNITY): Payer: BLUE CROSS/BLUE SHIELD

## 2017-03-29 DIAGNOSIS — Z1231 Encounter for screening mammogram for malignant neoplasm of breast: Secondary | ICD-10-CM | POA: Diagnosis not present

## 2017-03-30 ENCOUNTER — Ambulatory Visit (HOSPITAL_COMMUNITY): Payer: BLUE CROSS/BLUE SHIELD

## 2017-03-30 ENCOUNTER — Inpatient Hospital Stay (HOSPITAL_COMMUNITY): Payer: BLUE CROSS/BLUE SHIELD | Attending: Internal Medicine | Admitting: Internal Medicine

## 2017-03-30 ENCOUNTER — Encounter (HOSPITAL_COMMUNITY): Payer: Self-pay | Admitting: Internal Medicine

## 2017-03-30 ENCOUNTER — Other Ambulatory Visit: Payer: Self-pay

## 2017-03-30 VITALS — BP 107/66 | HR 91 | Temp 98.3°F | Resp 16 | Ht 65.0 in | Wt 188.8 lb

## 2017-03-30 DIAGNOSIS — M8580 Other specified disorders of bone density and structure, unspecified site: Secondary | ICD-10-CM | POA: Diagnosis not present

## 2017-03-30 DIAGNOSIS — E785 Hyperlipidemia, unspecified: Secondary | ICD-10-CM | POA: Diagnosis not present

## 2017-03-30 DIAGNOSIS — Z87891 Personal history of nicotine dependence: Secondary | ICD-10-CM | POA: Diagnosis not present

## 2017-03-30 DIAGNOSIS — Z853 Personal history of malignant neoplasm of breast: Secondary | ICD-10-CM | POA: Insufficient documentation

## 2017-03-30 DIAGNOSIS — F329 Major depressive disorder, single episode, unspecified: Secondary | ICD-10-CM | POA: Diagnosis not present

## 2017-03-30 DIAGNOSIS — M542 Cervicalgia: Secondary | ICD-10-CM | POA: Insufficient documentation

## 2017-03-30 DIAGNOSIS — Z170 Estrogen receptor positive status [ER+]: Secondary | ICD-10-CM | POA: Diagnosis not present

## 2017-03-30 DIAGNOSIS — M25512 Pain in left shoulder: Secondary | ICD-10-CM | POA: Insufficient documentation

## 2017-03-30 DIAGNOSIS — Z79899 Other long term (current) drug therapy: Secondary | ICD-10-CM | POA: Diagnosis not present

## 2017-03-30 DIAGNOSIS — Z9011 Acquired absence of right breast and nipple: Secondary | ICD-10-CM | POA: Diagnosis not present

## 2017-03-30 DIAGNOSIS — C50911 Malignant neoplasm of unspecified site of right female breast: Secondary | ICD-10-CM

## 2017-03-30 DIAGNOSIS — Z9221 Personal history of antineoplastic chemotherapy: Secondary | ICD-10-CM | POA: Diagnosis not present

## 2017-03-30 DIAGNOSIS — G2581 Restless legs syndrome: Secondary | ICD-10-CM | POA: Diagnosis not present

## 2017-03-30 DIAGNOSIS — K219 Gastro-esophageal reflux disease without esophagitis: Secondary | ICD-10-CM | POA: Insufficient documentation

## 2017-03-30 DIAGNOSIS — Z923 Personal history of irradiation: Secondary | ICD-10-CM | POA: Diagnosis not present

## 2017-03-30 NOTE — Patient Instructions (Signed)
[place] at [facility] Discharge Instructions   You were seen today by Dr. Zoila Shutter She went over your recent mammogram. She reviewed your medication with you and the pain you are having in your right breast. Return in 4 months for labs and follow up.    Thank you for choosing [place] at [facility] to provide your oncology and hematology care.  To afford each patient quality time with our provider, please arrive at least 15 minutes before your scheduled appointment time.    If you have a lab appointment with the [place] please come in thru the  Main Entrance and check in at the main information desk  You need to re-schedule your appointment should you arrive 10 or more minutes late.  We strive to give you quality time with our providers, and arriving late affects you and other patients whose appointments are after yours.  Also, if you no show three or more times for appointments you may be dismissed from the clinic at the providers discretion.     Again, thank you for choosing [facility]  Our hope is that these requests will decrease the amount of time that you wait before being seen by our physicians.       _____________________________________________________________  Should you have questions after your visit to [facility], please contact our office at [phone] between the hours of 8:30 a.m. and 4:30 p.m.  Voicemails left after 4:30 p.m. will not be returned until the following business day.  For prescription refill requests, have your pharmacy contact our office.       Resources For Cancer Patients and their Caregivers ? American Cancer Society: Can assist with transportation, wigs, general needs, runs Look Good Feel Better.        808 139 8335 ? Cancer Care: Provides financial assistance, online support groups, medication/co-pay assistance.  1-800-813-HOPE (219) 315-0763) ? [place] Assists [place] cancer patients and their families through emotional , educational and financial support.  219-490-9551 ? [place] DSS Where to apply for food stamps, Medicaid and utility assistance. 313 148 8524 ? [facility]: Transportation to medical appointments. 754-544-0116 ? Social Security Administration: May apply for disability if have a Stage IV cancer. (862)581-7743 252-102-2483 ? [employer], Disability and Transit Services: Assists with nutrition, care and transit needs. [place] Support Programs:   > Cancer Support Group  2nd Tuesday of the month 1pm-2pm, Journey Room   > Creative Journey  3rd Tuesday of the month 1130am-1pm, Journey Room

## 2017-03-31 ENCOUNTER — Other Ambulatory Visit (HOSPITAL_COMMUNITY): Payer: Self-pay | Admitting: Internal Medicine

## 2017-03-31 DIAGNOSIS — C50911 Malignant neoplasm of unspecified site of right female breast: Secondary | ICD-10-CM

## 2017-03-31 NOTE — Progress Notes (Signed)
Diagnosis No diagnosis found.  Staging Cancer Staging Recurrent breast cancer, right ([place]) Staging form: Breast, AJCC 7th Edition - Clinical: Stage IA (T1c, N0, M0) - Signed by Baird Cancer, PA-C on 09/11/2015   Assessment and Plan: 1.  Breast cancer, right breast 2014 T1c, N0.  She has recurrent right breast cancer.   She was originally staged as a pT1c N0 (0/3) invasive ductal carcinoma of the RIGHT breast, ER+, PR+, HER2-, grade 2, diagnosed and treated in 2014. She underwent RIGHT mastectomy and SLND with Dr. Carvel Getting at that time. She was seen by medical oncology closer to home at that time. Oncotype DX was 22. Chemotherapy was recommended but she only took 1 cycle of TC before declining further therapy due to side effects. She also declined endocrine therapy at that time.  She had routine follow up on 02/24/2015 she was noted to have a nodule of the RIGHT chest wall or axilla. Ultrasound at the OSH on 03/04/2015 reportedly showed an irregular 1.2 cm mass, hypoechoic centrally and hyperechoic peripherally. Mass was superficial and abutting the chest wall musculature. Several small axillary lymph nodes were also noted but did not appear to have suspicious morphology. OSH Bone scan 03/03/15 was negative for metastasis.  OSH PET scan -Negative.  OSH MRI Abd/pelvis- Negative.  She underwent Biopsy - 03/23/2015 pathology showed ER+, PR+, HER2- IDC and completed 4 cycles of AC chemotherapy with local medical oncologist.  She had right partial mastectomy with LN dissection 07/22/15 at [facility].    She has undergone left screening mammogram on 03/29/2017 that was negative for malignancy.  She reports she continues the medication prescribed by her holistic doctor.  She will continue to be followed closely as we did not recommend her current therapy.  She will be set up for left screening mammogram in 1 year.  She will return to clinic in 4 months for follow-up pending if she proceeds with PET scan evaluation.   2.   Joint discomfort.  She will be offered the option of PET scan for further evaluation especially based on her history and her current therapy.  Has orthopedic referral and this has been set up.  3.  Depression.  Continue to follow-up with primary care physician.  4.  Health maintenance.  She underwent bone density in August 2018 that showed osteopenia.  She was previously recommended for calcium and vitamin D.  Continue GI evaluation as recommended.  Interval History:  62 y.o. female with a medical history significant for breast cancer who returns today for ongoing follow-up of locally recurrent invasive ductal carcinoma with lobular features of right breast with recurrence to chest wall. She was followed at [facility], she was treated locally by Dr. Tressie Stalker, last seen on 06/17/2015. With the closure of [facility] she has transferred care here.  She reports she no longer is being seen at [facility].    She has seen Dr. Higinio Roger at [facility] on 09/03/2015.  Her recommendations follow: We reviewed Ms. Robben's previous diagnosis and treatment with her in detail and reviewed staging scans ordered by her local oncologist. I discussed with her that I am happy to serve in a consultative mode for her. I advised that we will provide treatment recommendations to her local providers, however, we cannot prescribe or drive the treatment at another facility. We discussed the challenges of having care administered by two different medical oncologists. She was advised that she could return for treatment here if she chooses to do this.   I  reviewed the multidisciplinary tumor board discussion frm 04/13/15. The overall recommendation was for neoadjuvant chemotherapy with [facility] given her poor tolerance to TC chemotherapy in the adjuvant setting.  I have recommended an echo for cardiac evaluation.  Would recommmend chemo teaching and start of AC as soon as echo could be done.  We would plan 4 cycles of AC and then reimage to assess for  response. If she could proceed to surgery at that time would consider this versus further reduction in tumor burden with taxol chemotherapy to complete AC-T.   She completed AC x 4 locally with Dr.Eric Neijstrom. She had right partial mastectomy 07/22/15 with Dr.Greenup  Today we reviewed in detail her surgical pathology, which noted residual breast cancer. We discussed the option of further chemotherapy given lack of pathologic CR to [facility]. We discussed the option of completing a more standard chemotherapy regimen by the addition of taxane therapy in the adjuvant setting. She is not inclined to do this. We also reviewed data regarding the addition of xeloda for non-path-CR patients, CREATE-X, NEJM, 2017. Jun 1;376(22):2147-2159. We discussed the option to proceed with radiation therapy combined with radiosensitizing agent Xeloda for better response. This would also be followed by endocrine therapy with Antihormone medication.   She states that she cannot return for f/u at [facility]. She is establishing care with another medical oncologist locally. We will forward recommendations to her local oncologists.  She will follow up with local medical oncologist to discuss plan.  She knows she also needs to follow up with radiaition oncology I would recommend adjuvant AI therapy for 5-10 years after completion of RT.   She was seen by Dr. Talbert Cage who reviewed CREAT-X trial with the patient.  We have reviewed the risks, benefits, alternatives, and side effects of Xeloda treatment including, but not limited to, nausea/vomiting, fatigue, stomatitis, diarrhea, palmar-plantar erythrodysesthesia, anaphylaxis, and death.  Rx is printed for Xeloda 2000 mg BID days 1-14 every 21 days (in accordance with the CREAT-X trial).  She will take Xeloda for 6-8 cycles.  AI therapy will be initiated following completion of XRT after bone density exam is completed.  Patient has completed 6 cycles of xeloda on 01/21/16 and does not want to take  the last 2 cycles due to adverse side effects.   She continues to take medication through a holistic approach to suppress her estrogen.  She had a bone density performed on 09/01/2016 which showed osteopenia.    Current Status:  Pt is seen today for follow-up  She is complaining of joint pain and left neck and shoulder pain.  She is here to go over recent mammogram.       Recurrent breast cancer, right ([place])   06/27/2012 Initial Diagnosis    Breast cancer, invasive ductal carcinoma with lobular features      09/12/2012 Definitive Surgery    Right mastectomy with sentinel node biopsy      09/12/2012 Pathology Results    2.1 x 2.0 cm primary tumor, grade 2, ER 90%, PR NEGATIVE, HER2 NEGATIVE, ONCOTYPE score 22      01/29/2013 - 02/19/2013 Chemotherapy    TC x 1 cycle       02/18/2013 Adverse Reaction    Patient decided to stop chemotherapy after 1 cycle due to poor tolerance with side effects.      03/11/2015 Relapse/Recurrence    Positive biopsy from right chest wall nodule.      03/25/2015 PET scan    New hypermetabolic soft tissue  nodule right anterior chest wall. Artifact versus small right hepatic metastatic lesion. Consider MRI for further evaluation      04/29/2015 - 06/10/2015 Chemotherapy    Dose-dense Adriamycin/Cytoxan x 4 cycles at [facility].  Last cycle requiring 25% dose reduction due to toxicities.      07/15/2015 Breast MRI    MRI breast- Slight interval decrease in size of biopsy proven recurrence in the right chest wall.No MRI evidence of malignancy in the left breast.      07/22/2015 Procedure    Right partial mastectomy by Dr. Carvel Getting ([place])       07/22/2015 Pathology Results    Right chest wall recurrent invasive ductal carcinoma with lobular features, intermediate grade ([id number]). ER+, PR +, HER2 NEGATIVE.      09/23/2015 Concurrent Chemotherapy    XRT with Xeloda 1250 mg/m2 days 1-14 every 21 days per [facility]: CREAT-X, NEJM 2017. Jun 1;376(22):2147-2159.   XRT  beginning on 9/6- 10/29/2015 Completed cycle 6 of Xeloda on 01/21/16       Problem List Patient Active Problem List   Diagnosis Date Noted  . Depression [F32.9] 02/17/2016  . Restless legs syndrome [G25.81] 07/15/2015  . Gastro-esophageal reflux disease without esophagitis [K21.9] 07/15/2015  . Recurrent breast cancer, right [facility]) [C50.911] 07/17/2012    Past Medical History Past Medical History:  Diagnosis Date  . Breast cancer ([place]) 2014  . Cancer ([place])   . Fuchs' corneal dystrophy   . Hyperlipidemia   . Personal history of chemotherapy   . Personal history of radiation therapy     Past Surgical History Past Surgical History:  Procedure Laterality Date  . APPENDECTOMY    . ENDOSCOPIC INSERTION PERITONEAL CATHETER PORT    . EXCISION MASS NECK    . MASTECTOMY Right 2015  . NASAL SEPTUM SURGERY      Family History Family History  Problem Relation Age of Onset  . Breast cancer Mother 44       bilateral cancer, second diagnosis at 4  . Stomach cancer Father 57  . Breast cancer Sister 75       bilateral breast cancer, second dx at 59  . Breast cancer Maternal Aunt        dx in her 21s  . Prostate cancer Maternal Grandfather        dx in his 7s-60s  . Lung cancer Sister 66       smoker  . Breast cancer Maternal Aunt        dx in her 49s  . Breast cancer Cousin        maternal cousin  . Breast cancer Cousin        maternal cousin  . Breast cancer Paternal Aunt 26  . Bone cancer Paternal Uncle        thinks the cancer started somewhere else  . Breast cancer Paternal Aunt        dx in her 62s  . Cancer Cousin        dx in her 43s, a "female" cancer     Social History  reports that she quit smoking about 14 years ago. She has a 30.00 pack-year smoking history. she has never used smokeless tobacco. She reports that she does not drink alcohol or use drugs.  Medications  Current Outpatient Medications:  .  ALPRAZolam (XANAX) 0.5 MG tablet, Take 1 tablet (0.5  mg total) by mouth 3 (three) times daily as needed for anxiety., Disp: 45 tablet, Rfl: 0 .  Ascorbic  Acid (VITAMIN C) 1000 MG tablet, Take 1,000 mg by mouth daily., Disp: , Rfl:  .  Cholecalciferol (VITAMIN D3) 5000 units TABS, Take by mouth., Disp: , Rfl:  .  Flaxseed, Linseed, (FLAXSEED OIL) 1000 MG CAPS, Take 1,000 mg by mouth daily., Disp: , Rfl:  .  Iodine, Kelp, (KELP PO), Take 600 mg by mouth daily., Disp: , Rfl:  .  mirtazapine (REMERON) 30 MG tablet, , Disp: , Rfl:  .  montelukast (SINGULAIR) 10 MG tablet, , Disp: , Rfl:  .  NON FORMULARY, Testosterone '100mg'$ , Anastrozole '4mg'$ , Stearic acid 5.'5mg'$  Pellet, Disp: , Rfl:  .  temazepam (RESTORIL) 30 MG capsule, , Disp: , Rfl:  .  venlafaxine XR (EFFEXOR-XR) 150 MG 24 hr capsule, Take by mouth., Disp: , Rfl:  .  vitamin A 8000 UNIT capsule, Take 8,000 Units by mouth daily., Disp: , Rfl:  .  vitamin E 400 UNIT capsule, Take 400 Units by mouth daily., Disp: , Rfl:  No current facility-administered medications for this visit.   Facility-Administered Medications Ordered in Other Visits:  .  heparin lock flush 100 unit/mL, 500 Units, Intravenous, Once, Kefalas, Thomas S, PA-C .  sodium chloride flush (NS) 0.9 % injection 20 mL, 20 mL, Intravenous, PRN, Kefalas, Thomas S, PA-C  Allergies Codeine and Gabapentin  Review of Systems Review of Systems - Oncology ROS as per HPI otherwise 12 point ROS is negative.   Physical Exam  Vitals Wt Readings from Last 3 Encounters:  03/30/17 188 lb 12.8 oz (85.6 kg)  11/21/16 190 lb (86.2 kg)  08/19/16 187 lb 8 oz (85 kg)   Temp Readings from Last 3 Encounters:  03/30/17 98.3 F (36.8 C) (Oral)  11/21/16 98.4 F (36.9 C) (Oral)  02/17/16 97.9 F (36.6 C) (Oral)   BP Readings from Last 3 Encounters:  03/30/17 107/66  11/21/16 133/72  08/19/16 120/74   Pulse Readings from Last 3 Encounters:  03/30/17 91  11/21/16 93  08/19/16 86   Constitutional: Well-developed, well-nourished, and in no  distress.   HENT: Head: Normocephalic and atraumatic.  Mouth/Throat: No oropharyngeal exudate. Mucosa moist. Eyes: Pupils are equal, round, and reactive to light. Conjunctivae are normal. No scleral icterus.  Neck: Normal range of motion. Neck supple. No JVD present.  Cardiovascular: Normal rate, regular rhythm and normal heart sounds.  Exam reveals no gallop and no friction rub.   No murmur heard. Pulmonary/Chest: Effort normal and breath sounds normal. No respiratory distress. No wheezes.No rales.  Abdominal: Soft. Bowel sounds are normal. No distension. There is no tenderness. There is no guarding.  Musculoskeletal: No edema or tenderness.   She reports some leg left neck and shoulder discomfort. Lymphadenopathy: No cervical, axillary or supraclavicular adenopathy.  Neurological: Alert and oriented to person, place, and time. No cranial nerve deficit.  Skin: Skin is warm and dry. No rash noted. No erythema. No pallor.  Psychiatric: Affect and judgment normal.  Bilateral breast exam.  Chaperone present.  Left breast Shows no dominant masses no nipple discharge no retractions.  Right chest wall shows no evidence of recurrence.  Labs No visits with results within 3 Day(s) from this visit.  Latest known visit with results is:  Infusion on 12/07/2015  Component Date Value Ref Range Status  . WBC 12/07/2015 3.4* 4.0 - 10.5 K/uL Final  . RBC 12/07/2015 4.05  3.87 - 5.11 MIL/uL Final  . Hemoglobin 12/07/2015 12.4  12.0 - 15.0 g/dL Final  . HCT 12/07/2015 36.6  36.0 -  46.0 % Final  . MCV 12/07/2015 90.4  78.0 - 100.0 fL Final  . MCH 12/07/2015 30.6  26.0 - 34.0 pg Final  . MCHC 12/07/2015 33.9  30.0 - 36.0 g/dL Final  . RDW 12/07/2015 16.0* 11.5 - 15.5 % Final  . Platelets 12/07/2015 170  150 - 400 K/uL Final  . Neutrophils Relative % 12/07/2015 69  % Final  . Neutro Abs 12/07/2015 2.4  1.7 - 7.7 K/uL Final  . Lymphocytes Relative 12/07/2015 22  % Final  . Lymphs Abs 12/07/2015 0.7  0.7 -  4.0 K/uL Final  . Monocytes Relative 12/07/2015 9  % Final  . Monocytes Absolute 12/07/2015 0.3  0.1 - 1.0 K/uL Final  . Eosinophils Relative 12/07/2015 0  % Final  . Eosinophils Absolute 12/07/2015 0.0  0.0 - 0.7 K/uL Final  . Basophils Relative 12/07/2015 0  % Final  . Basophils Absolute 12/07/2015 0.0  0.0 - 0.1 K/uL Final  . Sodium 12/07/2015 137  135 - 145 mmol/L Final  . Potassium 12/07/2015 3.8  3.5 - 5.1 mmol/L Final  . Chloride 12/07/2015 104  101 - 111 mmol/L Final  . CO2 12/07/2015 28  22 - 32 mmol/L Final  . Glucose, Bld 12/07/2015 111* 65 - 99 mg/dL Final  . BUN 12/07/2015 13  6 - 20 mg/dL Final  . Creatinine, Ser 12/07/2015 0.58  0.44 - 1.00 mg/dL Final  . Calcium 12/07/2015 8.9  8.9 - 10.3 mg/dL Final  . Total Protein 12/07/2015 7.0  6.5 - 8.1 g/dL Final  . Albumin 12/07/2015 4.0  3.5 - 5.0 g/dL Final  . AST 12/07/2015 18  15 - 41 U/L Final  . ALT 12/07/2015 22  14 - 54 U/L Final  . Alkaline Phosphatase 12/07/2015 85  38 - 126 U/L Final  . Total Bilirubin 12/07/2015 0.5  0.3 - 1.2 mg/dL Final  . GFR calc non Af Amer 12/07/2015 >60  >60 mL/min Final  . GFR calc Af Amer 12/07/2015 >60  >60 mL/min Final   Comment: (NOTE) The eGFR has been calculated using the CKD EPI equation. This calculation has not been validated in all clinical situations. eGFR's persistently <60 mL/min signify possible Chronic Kidney Disease.   . Anion gap 12/07/2015 5  5 - 15 Final     PATHOLOGY:     Left breast screening mammogram done 04/01/2017:   IMPRESSION: No mammographic evidence of malignancy.   Repeat imaging recommended in 1 year.     Zoila Shutter MD

## 2017-04-03 ENCOUNTER — Encounter (HOSPITAL_COMMUNITY): Payer: Self-pay | Admitting: Lab

## 2017-04-03 NOTE — Progress Notes (Unsigned)
Referral sent to Dr Vertell Limber. Records faxed on 3/18

## 2017-04-10 ENCOUNTER — Other Ambulatory Visit (HOSPITAL_COMMUNITY): Payer: Self-pay | Admitting: Internal Medicine

## 2017-04-10 DIAGNOSIS — M898X9 Other specified disorders of bone, unspecified site: Secondary | ICD-10-CM

## 2017-04-10 DIAGNOSIS — C50911 Malignant neoplasm of unspecified site of right female breast: Secondary | ICD-10-CM

## 2017-04-17 ENCOUNTER — Other Ambulatory Visit (HOSPITAL_COMMUNITY): Payer: BLUE CROSS/BLUE SHIELD

## 2017-04-24 ENCOUNTER — Encounter (HOSPITAL_COMMUNITY)
Admission: RE | Admit: 2017-04-24 | Discharge: 2017-04-24 | Disposition: A | Discharge status: Home / Self Care | Payer: BLUE CROSS/BLUE SHIELD | Source: Ambulatory Visit | Attending: Internal Medicine | Admitting: Internal Medicine

## 2017-04-24 ENCOUNTER — Encounter (HOSPITAL_COMMUNITY): Payer: Self-pay

## 2017-04-24 ENCOUNTER — Ambulatory Visit (HOSPITAL_COMMUNITY)
Admission: RE | Admit: 2017-04-24 | Discharge: 2017-04-24 | Disposition: A | Discharge status: Home / Self Care | Payer: BLUE CROSS/BLUE SHIELD | Source: Ambulatory Visit | Attending: Internal Medicine | Admitting: Internal Medicine

## 2017-04-24 DIAGNOSIS — Z9011 Acquired absence of right breast and nipple: Secondary | ICD-10-CM | POA: Diagnosis not present

## 2017-04-24 DIAGNOSIS — M898X9 Other specified disorders of bone, unspecified site: Secondary | ICD-10-CM

## 2017-04-24 DIAGNOSIS — R918 Other nonspecific abnormal finding of lung field: Secondary | ICD-10-CM | POA: Diagnosis not present

## 2017-04-24 DIAGNOSIS — C50911 Malignant neoplasm of unspecified site of right female breast: Secondary | ICD-10-CM

## 2017-04-24 LAB — POCT I-STAT CREATININE: Creatinine, Ser: 0.9 mg/dL (ref 0.44–1.00)

## 2017-04-24 MED ORDER — IOPAMIDOL (ISOVUE-300) INJECTION 61%
100.0000 mL | Freq: Once | INTRAVENOUS | Status: AC | PRN
  Administered 2017-04-24: 100 mL via INTRAVENOUS

## 2017-04-24 MED ORDER — TECHNETIUM TC 99M MEDRONATE IV KIT
20.0000 | PACK | Freq: Once | INTRAVENOUS | Status: AC | PRN
  Administered 2017-04-24: 22.7 via INTRAVENOUS

## 2017-05-09 ENCOUNTER — Telehealth (HOSPITAL_COMMUNITY): Payer: Self-pay

## 2017-05-09 NOTE — Telephone Encounter (Signed)
Patient called wanting the results of her bone scan and CT's performed earlier this month. Reviewed with Dr. Walden Field. I called patient back and explained that our new physicians don't normally give results out over the phone or before the follow up visit. The new physicians like to go over results with patients in person. With that being said , I informed Dr. Walden Field that patients follow up is not scheduled until July. Dr. Walden Field states I can tell patient that Bone scan was negative and CT's showed no suspicious findings. Relayed this to the patient and encouraged her to keep the follow up appt in July so the MD can go over all results in depth. Patient verbalized understanding.

## 2017-07-31 ENCOUNTER — Other Ambulatory Visit (HOSPITAL_COMMUNITY): Payer: Self-pay | Admitting: *Deleted

## 2017-07-31 DIAGNOSIS — C50911 Malignant neoplasm of unspecified site of right female breast: Secondary | ICD-10-CM

## 2017-08-01 ENCOUNTER — Other Ambulatory Visit (HOSPITAL_COMMUNITY): Payer: BLUE CROSS/BLUE SHIELD

## 2017-08-01 ENCOUNTER — Ambulatory Visit (HOSPITAL_COMMUNITY): Payer: BLUE CROSS/BLUE SHIELD | Admitting: Internal Medicine

## 2017-10-27 DIAGNOSIS — H9313 Tinnitus, bilateral: Secondary | ICD-10-CM | POA: Diagnosis not present

## 2017-10-27 DIAGNOSIS — H903 Sensorineural hearing loss, bilateral: Secondary | ICD-10-CM | POA: Diagnosis not present

## 2017-11-06 DIAGNOSIS — Z9189 Other specified personal risk factors, not elsewhere classified: Secondary | ICD-10-CM | POA: Diagnosis not present

## 2017-11-06 DIAGNOSIS — E782 Mixed hyperlipidemia: Secondary | ICD-10-CM | POA: Diagnosis not present

## 2017-11-06 DIAGNOSIS — R202 Paresthesia of skin: Secondary | ICD-10-CM | POA: Diagnosis not present

## 2017-11-06 DIAGNOSIS — R300 Dysuria: Secondary | ICD-10-CM | POA: Diagnosis not present

## 2017-11-06 DIAGNOSIS — Z23 Encounter for immunization: Secondary | ICD-10-CM | POA: Diagnosis not present

## 2017-11-09 DIAGNOSIS — J301 Allergic rhinitis due to pollen: Secondary | ICD-10-CM | POA: Diagnosis not present

## 2017-11-09 DIAGNOSIS — E559 Vitamin D deficiency, unspecified: Secondary | ICD-10-CM | POA: Diagnosis not present

## 2017-11-09 DIAGNOSIS — C50911 Malignant neoplasm of unspecified site of right female breast: Secondary | ICD-10-CM | POA: Diagnosis not present

## 2017-11-09 DIAGNOSIS — G4733 Obstructive sleep apnea (adult) (pediatric): Secondary | ICD-10-CM | POA: Diagnosis not present

## 2017-11-09 DIAGNOSIS — E782 Mixed hyperlipidemia: Secondary | ICD-10-CM | POA: Diagnosis not present

## 2017-11-10 DIAGNOSIS — Z87891 Personal history of nicotine dependence: Secondary | ICD-10-CM | POA: Diagnosis not present

## 2017-11-10 DIAGNOSIS — M542 Cervicalgia: Secondary | ICD-10-CM | POA: Diagnosis not present

## 2017-11-10 DIAGNOSIS — Z79899 Other long term (current) drug therapy: Secondary | ICD-10-CM | POA: Diagnosis not present

## 2017-11-10 DIAGNOSIS — R252 Cramp and spasm: Secondary | ICD-10-CM | POA: Diagnosis not present

## 2017-11-10 DIAGNOSIS — E7800 Pure hypercholesterolemia, unspecified: Secondary | ICD-10-CM | POA: Diagnosis not present

## 2017-11-10 DIAGNOSIS — Z853 Personal history of malignant neoplasm of breast: Secondary | ICD-10-CM | POA: Diagnosis not present

## 2017-11-13 ENCOUNTER — Ambulatory Visit (HOSPITAL_COMMUNITY): Payer: BLUE CROSS/BLUE SHIELD | Admitting: Internal Medicine

## 2017-11-13 ENCOUNTER — Other Ambulatory Visit (HOSPITAL_COMMUNITY): Payer: BLUE CROSS/BLUE SHIELD

## 2017-11-17 ENCOUNTER — Other Ambulatory Visit (HOSPITAL_COMMUNITY): Payer: Self-pay

## 2017-11-17 ENCOUNTER — Inpatient Hospital Stay (HOSPITAL_COMMUNITY): Payer: Medicare Other | Attending: Internal Medicine | Admitting: Internal Medicine

## 2017-11-17 ENCOUNTER — Encounter (HOSPITAL_COMMUNITY): Payer: Self-pay | Admitting: Internal Medicine

## 2017-11-17 ENCOUNTER — Ambulatory Visit (HOSPITAL_COMMUNITY): Payer: Self-pay | Admitting: Internal Medicine

## 2017-11-17 VITALS — BP 117/61 | HR 79 | Temp 98.6°F | Resp 18 | Wt 185.5 lb

## 2017-11-17 DIAGNOSIS — M8580 Other specified disorders of bone density and structure, unspecified site: Secondary | ICD-10-CM | POA: Diagnosis not present

## 2017-11-17 DIAGNOSIS — R918 Other nonspecific abnormal finding of lung field: Secondary | ICD-10-CM

## 2017-11-17 DIAGNOSIS — R945 Abnormal results of liver function studies: Secondary | ICD-10-CM | POA: Diagnosis not present

## 2017-11-17 DIAGNOSIS — Z79899 Other long term (current) drug therapy: Secondary | ICD-10-CM

## 2017-11-17 DIAGNOSIS — C50911 Malignant neoplasm of unspecified site of right female breast: Secondary | ICD-10-CM | POA: Diagnosis not present

## 2017-11-17 DIAGNOSIS — Z9221 Personal history of antineoplastic chemotherapy: Secondary | ICD-10-CM

## 2017-11-17 DIAGNOSIS — R911 Solitary pulmonary nodule: Secondary | ICD-10-CM

## 2017-11-17 DIAGNOSIS — Z170 Estrogen receptor positive status [ER+]: Secondary | ICD-10-CM

## 2017-11-17 DIAGNOSIS — Z87891 Personal history of nicotine dependence: Secondary | ICD-10-CM

## 2017-11-17 DIAGNOSIS — Z9011 Acquired absence of right breast and nipple: Secondary | ICD-10-CM

## 2017-11-17 DIAGNOSIS — F329 Major depressive disorder, single episode, unspecified: Secondary | ICD-10-CM

## 2017-11-17 NOTE — Progress Notes (Signed)
Diagnosis Recurrent breast cancer, right ([place]) - Plan: CBC with Differential/Platelet, Comprehensive metabolic panel, Lactate dehydrogenase, MM 3D SCREEN BREAST UNI LEFT, CANCELED: MM Digital Diagnostic Unilat L  Abnormal findings on diagnostic imaging of lung - Plan: CT CHEST W CONTRAST  Lung nodule - Plan: CBC with Differential/Platelet, Comprehensive metabolic panel, Lactate dehydrogenase, MM 3D SCREEN BREAST UNI LEFT, CANCELED: MM Digital Diagnostic Unilat L  Staging Cancer Staging Recurrent breast cancer, right ([place]) Staging form: Breast, AJCC 7th Edition - Clinical: Stage IA (T1c, N0, M0) - Signed by Baird Cancer, PA-C on 09/11/2015   Assessment and Plan:  1.  Breast cancer, right breast 2014 T1c, N0.  She has recurrent right breast cancer.   She was originally staged as a pT1c N0 (0/3) invasive ductal carcinoma of the RIGHT breast, ER+, PR+, HER2-, grade 2, diagnosed and treated in 2014. She underwent RIGHT mastectomy and SLND with Dr. Carvel Getting at that time. She was seen by medical oncology closer to home at that time. Oncotype DX was 22. Chemotherapy was recommended but she only took 1 cycle of TC before declining further therapy due to side effects. She also declined endocrine therapy at that time.  She had routine follow up on 02/24/2015 she was noted to have a nodule of the RIGHT chest wall or axilla. Ultrasound at the OSH on 03/04/2015 reportedly showed an irregular 1.2 cm mass, hypoechoic centrally and hyperechoic peripherally. Mass was superficial and abutting the chest wall musculature. Several small axillary lymph nodes were also noted but did not appear to have suspicious morphology. OSH Bone scan 03/03/15 was negative for metastasis.  OSH PET scan -Negative.  OSH MRI Abd/pelvis- Negative.  She underwent Biopsy - 03/23/2015 pathology showed ER+, PR+, HER2- IDC and completed 4 cycles of AC chemotherapy with local medical oncologist.  She had right partial mastectomy with LN dissection  07/22/15 at [facility].    She had left screening mammogram on 03/29/2017 that was negative for malignancy.  Pt had CT CAP done 04/24/2017 that showed  IMPRESSION: 1. Surgical changes from a right mastectomy and right axillary lymph node dissection. No findings suspicious for recurrent chest wall tumor or adenopathy. 2. Small pulmonary nodules appear stable and benign. No findings suspicious for pulmonary metastatic disease. 3. No abdominal/pelvic metastatic disease or osseous metastatic disease. 4. No acute abdominal/pelvic findings or adenopathy.  Bone scan done 04/24/2017 shows no evidence of bone mets.    She reports she continues the medication prescribed by her holistic doctor.  [facility]CC has not recommend nor do we oversee her current therapy.  She is set up for left screening mammogram in 03/2018 and will follow-up at that time to go over results.  She should notify the office if she has any problems prior to her next visit.    2.  Abnormal LFTs.  Pt brings in outside labs done by Dr. Gar Ponto on 11/06/2017.  ALT was 147 and alkaline phosphatase was 120. She reports she was placed on a cholesterol medication by his office.  I discussed with her she should follow-up with his office ASAP to discuss labs.  She had previous blood work done that by [facility] that showed no liver abnormalities.  I discussed with her recent scan done in 04/2017 showed no liver abnormalities.  If lab abnormalities persist, his office may have to consider repeat imaging.    3.  Pulmonary nodule.  Scan done 04/2017 showed small stable nodules that were felt to be benign.  She is set up  for CT chest in 03/2018 for ongoing follow-up.    4.  Depression.  Continue to follow-up with primary care physician.  5.  Health maintenance.  She underwent bone density in August 2018 that showed osteopenia.  She was previously recommended for calcium and vitamin D.  Repeat BMD in 08/2018.  Continue GI follow-up as recommended.  25 minutes spent with  more than 50% spent in counseling and coordination of care.    Interval History:  Historical data obtained from note dated 03/30/2017:  62 y.o. female with a medical history significant for breast cancer who returns today for ongoing follow-up of locally recurrent invasive ductal carcinoma with lobular features of right breast with recurrence to chest wall. She was followed at [facility], she was treated locally by Dr. Tressie Stalker, last seen on 06/17/2015. With the closure of [facility] she has transferred care here.  She reports she no longer is being seen at [facility].    She has seen Dr. Higinio Roger at [facility] on 09/03/2015.  Her recommendations follow: We reviewed Ms. [place]'s previous diagnosis and treatment with her in detail and reviewed staging scans ordered by her local oncologist. I discussed with her that I am happy to serve in a consultative mode for her. I advised that we will provide treatment recommendations to her local providers, however, we cannot prescribe or drive the treatment at another facility. We discussed the challenges of having care administered by two different medical oncologists. She was advised that she could return for treatment here if she chooses to do this.   I reviewed the multidisciplinary tumor board discussion frm 04/13/15. The overall recommendation was for neoadjuvant chemotherapy with [facility] given her poor tolerance to TC chemotherapy in the adjuvant setting.  I have recommended an echo for cardiac evaluation.  Would recommmend chemo teaching and start of AC as soon as echo could be done.  We would plan 4 cycles of AC and then reimage to assess for response. If she could proceed to surgery at that time would consider this versus further reduction in tumor burden with taxol chemotherapy to complete AC-T.   She completed AC x 4 locally with Dr.Eric Neijstrom. She had right partial mastectomy 07/22/15 with Dr.Greenup  Today we reviewed in detail her surgical pathology, which noted  residual breast cancer. We discussed the option of further chemotherapy given lack of pathologic CR to [facility]. We discussed the option of completing a more standard chemotherapy regimen by the addition of taxane therapy in the adjuvant setting. She is not inclined to do this. We also reviewed data regarding the addition of xeloda for non-path-CR patients, CREATE-X, NEJM, 2017. Jun 1;376(22):2147-2159. We discussed the option to proceed with radiation therapy combined with radiosensitizing agent Xeloda for better response. This would also be followed by endocrine therapy with Antihormone medication.   She states that she cannot return for f/u at [facility]. She is establishing care with another medical oncologist locally. We will forward recommendations to her local oncologists.  She will follow up with local medical oncologist to discuss plan.  She knows she also needs to follow up with radiaition oncology I would recommend adjuvant AI therapy for 5-10 years after completion of RT.   She was seen by Dr. Talbert Cage who reviewed CREAT-X trial with the patient.  We have reviewed the risks, benefits, alternatives, and side effects of Xeloda treatment including, but not limited to, nausea/vomiting, fatigue, stomatitis, diarrhea, palmar-plantar erythrodysesthesia, anaphylaxis, and death.  Rx is printed for Xeloda 2000 mg BID  days 1-14 every 21 days (in accordance with the CREAT-X trial).  She will take Xeloda for 6-8 cycles.  AI therapy will be initiated following completion of XRT after bone density exam is completed.  Patient has completed 6 cycles of xeloda on 01/21/16 and does not want to take the last 2 cycles due to adverse side effects.   She continues to take medication through a holistic approach to suppress her estrogen.  She had a bone density performed on 09/01/2016 which showed osteopenia.    Current Status:  Pt is seen today for follow-up  She comes in with outside lab report.      Recurrent breast cancer,  right ([place])   06/27/2012 Initial Diagnosis    Breast cancer, invasive ductal carcinoma with lobular features    09/12/2012 Definitive Surgery    Right mastectomy with sentinel node biopsy    09/12/2012 Pathology Results    2.1 x 2.0 cm primary tumor, grade 2, ER 90%, PR NEGATIVE, HER2 NEGATIVE, ONCOTYPE score 22    01/29/2013 - 02/19/2013 Chemotherapy    TC x 1 cycle     02/18/2013 Adverse Reaction    Patient decided to stop chemotherapy after 1 cycle due to poor tolerance with side effects.    03/11/2015 Relapse/Recurrence    Positive biopsy from right chest wall nodule.    03/25/2015 PET scan    New hypermetabolic soft tissue nodule right anterior chest wall. Artifact versus small right hepatic metastatic lesion. Consider MRI for further evaluation    04/29/2015 - 06/10/2015 Chemotherapy    Dose-dense Adriamycin/Cytoxan x 4 cycles at [facility].  Last cycle requiring 25% dose reduction due to toxicities.    07/15/2015 Breast MRI    MRI breast- Slight interval decrease in size of biopsy proven recurrence in the right chest wall.No MRI evidence of malignancy in the left breast.    07/22/2015 Procedure    Right partial mastectomy by Dr. Carvel Getting ([place])     07/22/2015 Pathology Results    Right chest wall recurrent invasive ductal carcinoma with lobular features, intermediate grade ([id number]). ER+, PR +, HER2 NEGATIVE.    09/23/2015 Concurrent Chemotherapy    XRT with Xeloda 1250 mg/m2 days 1-14 every 21 days per [facility]: CREAT-X, NEJM 2017. Jun 1;376(22):2147-2159.   XRT beginning on 9/6- 10/29/2015 Completed cycle 6 of Xeloda on 01/21/16      Problem List Patient Active Problem List   Diagnosis Date Noted  . Depression [F32.9] 02/17/2016  . Restless legs syndrome [G25.81] 07/15/2015  . Gastro-esophageal reflux disease without esophagitis [K21.9] 07/15/2015  . Recurrent breast cancer, right [facility]) [C50.911] 07/17/2012    Past Medical History Past Medical History:  Diagnosis Date  . Breast  cancer ([place]) 2014  . Cancer ([place])   . Fuchs' corneal dystrophy   . Hyperlipidemia   . Personal history of chemotherapy   . Personal history of radiation therapy     Past Surgical History Past Surgical History:  Procedure Laterality Date  . APPENDECTOMY    . ENDOSCOPIC INSERTION PERITONEAL CATHETER PORT    . EXCISION MASS NECK    . MASTECTOMY Right 2015  . NASAL SEPTUM SURGERY      Family History Family History  Problem Relation Age of Onset  . Breast cancer Mother 66       bilateral cancer, second diagnosis at 28  . Stomach cancer Father 50  . Breast cancer Sister 45       bilateral breast cancer, second dx at 80  .  Breast cancer Maternal Aunt        dx in her 53s  . Prostate cancer Maternal Grandfather        dx in his 35s-60s  . Lung cancer Sister 7       smoker  . Breast cancer Maternal Aunt        dx in her 17s  . Breast cancer Cousin        maternal cousin  . Breast cancer Cousin        maternal cousin  . Breast cancer Paternal Aunt 31  . Bone cancer Paternal Uncle        thinks the cancer started somewhere else  . Breast cancer Paternal Aunt        dx in her 68s  . Cancer Cousin        dx in her 75s, a "female" cancer     Social History  reports that she quit smoking about 15 years ago. She has a 30.00 pack-year smoking history. She has never used smokeless tobacco. She reports that she does not drink alcohol or use drugs.  Medications  Current Outpatient Medications:  .  ALPRAZolam (XANAX) 0.5 MG tablet, alprazolam 0.5 mg tablet  take 1 tablet by mouth four times a day, Disp: , Rfl:  .  tobramycin-dexamethasone (TOBRADEX) ophthalmic solution, tobramycin 0.3 %-dexamethasone 0.1 % eye drops,suspension, Disp: , Rfl:  .  acetaminophen (TYLENOL) 500 MG tablet, Take by mouth., Disp: , Rfl:  .  Ascorbic Acid (VITAMIN C) 1000 MG tablet, Take 1,000 mg by mouth daily., Disp: , Rfl:  .  busPIRone (BUSPAR) 15 MG tablet, TK 1 T PO TID FOR ANXIETY, Disp: , Rfl: 6 .   Cholecalciferol (VITAMIN D3) 5000 units TABS, Take by mouth., Disp: , Rfl:  .  cycloSPORINE (RESTASIS) 0.05 % ophthalmic emulsion, Restasis MultiDose 0.05 % eye drops, Disp: , Rfl:  .  ipratropium (ATROVENT) 0.06 % nasal spray, ipratropium bromide 42 mcg (0.06 %) nasal spray, Disp: , Rfl:  .  liothyronine (CYTOMEL) 25 MCG tablet, liothyronine 25 mcg tablet, Disp: , Rfl:  .  mirtazapine (REMERON) 30 MG tablet, mirtazapine 30 mg tablet, Disp: , Rfl:  .  NATURE-THROID 65 MG tablet, TK 1 AND 1/2 TS PO QD, Disp: , Rfl: 0 .  NON FORMULARY, Testosterone 164m, Anastrozole 418m Stearic acid 5.69m57mellet, Disp: , Rfl:  .  PREVIDENT 5000 DRY MOUTH 1.1 % GEL dental gel, , Disp: , Rfl: 0 .  rosuvastatin (CRESTOR) 5 MG tablet, TK 1 T PO QD, Disp: , Rfl: 1 .  temazepam (RESTORIL) 30 MG capsule, temazepam 30 mg capsule, Disp: , Rfl:  .  venlafaxine XR (EFFEXOR-XR) 150 MG 24 hr capsule, venlafaxine ER 150 mg capsule,extended release 24 hr, Disp: , Rfl:  .  vitamin A 8000 UNIT capsule, Take 8,000 Units by mouth daily., Disp: , Rfl:  .  vitamin E 400 UNIT capsule, Take 400 Units by mouth daily., Disp: , Rfl:  .  XIIDRA 5 % SOLN, , Disp: , Rfl:  No current facility-administered medications for this visit.   Facility-Administered Medications Ordered in Other Visits:  .  heparin lock flush 100 unit/mL, 500 Units, Intravenous, Once, Kefalas, Thomas S, PA-C .  sodium chloride flush (NS) 0.9 % injection 20 mL, 20 mL, Intravenous, PRN, Kefalas, Thomas S, PA-C  Allergies Codeine and Gabapentin  Review of Systems Review of Systems - Oncology ROS negative   Physical Exam  Vitals Wt Readings from Last 3 Encounters:  11/17/17 185 lb 8 oz (84.1 kg)  03/30/17 188 lb 12.8 oz (85.6 kg)  11/21/16 190 lb (86.2 kg)   Temp Readings from Last 3 Encounters:  11/17/17 98.6 F (37 C) (Oral)  03/30/17 98.3 F (36.8 C) (Oral)  11/21/16 98.4 F (36.9 C) (Oral)   BP Readings from Last 3 Encounters:  11/17/17 117/61   03/30/17 107/66  11/21/16 133/72   Pulse Readings from Last 3 Encounters:  11/17/17 79  03/30/17 91  11/21/16 93   Constitutional: Well-developed, well-nourished, and in no distress.   HENT: Head: Normocephalic and atraumatic.  Mouth/Throat: No oropharyngeal exudate. Mucosa moist. Eyes: Pupils are equal, round, and reactive to light. Conjunctivae are normal. No scleral icterus.  Neck: Normal range of motion. Neck supple. No JVD present.  Cardiovascular: Normal rate, regular rhythm and normal heart sounds.  Exam reveals no gallop and no friction rub.   No murmur heard. Pulmonary/Chest: Effort normal and breath sounds normal. No respiratory distress. No wheezes.No rales.  Abdominal: Soft. Bowel sounds are normal. No distension. There is no tenderness. There is no guarding.  Musculoskeletal: No edema or tenderness.  Lymphadenopathy: No cervical, axillary or supraclavicular adenopathy.  Neurological: Alert and oriented to person, place, and time. No cranial nerve deficit.  Skin: Skin is warm and dry. No rash noted. No erythema. No pallor.  Psychiatric: Affect and judgment normal.  Breast exam:  Tori present as Chaperone.  Right mastectomy.  No palpable evidence of chest wall recurrence.  Left breast shows no palpable dominant masses  Labs No visits with results within 3 Day(s) from this visit.  Latest known visit with results is:  Hospital Outpatient Visit on 04/24/2017  Component Date Value Ref Range Status  . Creatinine, Ser 04/24/2017 0.90  0.44 - 1.00 mg/dL Final     Pathology Orders Placed This Encounter  Procedures  . CT CHEST W CONTRAST    Standing Status:   Future    Standing Expiration Date:   11/17/2018    Order Specific Question:   If indicated for the ordered procedure, I authorize the administration of contrast media per Radiology protocol    Answer:   Yes    Order Specific Question:   Preferred imaging location?    Answer:   [facility]    Order Specific  Question:   Radiology Contrast Protocol - do NOT remove file path    Answer:   _0 [email or web address]  . MM 3D SCREEN BREAST UNI LEFT    Standing Status:   Future    Standing Expiration Date:   11/17/2018    Order Specific Question:   Reason for Exam (SYMPTOM  OR DIAGNOSIS REQUIRED)    Answer:   right breast cancer    Order Specific Question:   Preferred imaging location?    Answer:   [facility]  . CBC with Differential/Platelet    Standing Status:   Future    Standing Expiration Date:   11/18/2019  . Comprehensive metabolic panel    Standing Status:   Future    Standing Expiration Date:   11/18/2019  . Lactate dehydrogenase    Standing Status:   Future    Standing Expiration Date:   11/18/2019       Zoila Shutter MD

## 2017-11-21 DIAGNOSIS — R740 Nonspecific elevation of levels of transaminase and lactic acid dehydrogenase [LDH]: Secondary | ICD-10-CM | POA: Diagnosis not present

## 2017-11-27 DIAGNOSIS — H1851 Endothelial corneal dystrophy: Secondary | ICD-10-CM | POA: Diagnosis not present

## 2017-11-27 DIAGNOSIS — H04123 Dry eye syndrome of bilateral lacrimal glands: Secondary | ICD-10-CM | POA: Diagnosis not present

## 2017-11-27 DIAGNOSIS — H16223 Keratoconjunctivitis sicca, not specified as Sjogren's, bilateral: Secondary | ICD-10-CM | POA: Diagnosis not present

## 2017-12-05 DIAGNOSIS — M542 Cervicalgia: Secondary | ICD-10-CM | POA: Diagnosis not present

## 2018-01-08 DIAGNOSIS — G4700 Insomnia, unspecified: Secondary | ICD-10-CM | POA: Diagnosis not present

## 2018-02-05 DIAGNOSIS — E782 Mixed hyperlipidemia: Secondary | ICD-10-CM | POA: Diagnosis not present

## 2018-02-05 DIAGNOSIS — R7301 Impaired fasting glucose: Secondary | ICD-10-CM | POA: Diagnosis not present

## 2018-02-05 DIAGNOSIS — R748 Abnormal levels of other serum enzymes: Secondary | ICD-10-CM | POA: Diagnosis not present

## 2018-02-08 DIAGNOSIS — G4733 Obstructive sleep apnea (adult) (pediatric): Secondary | ICD-10-CM | POA: Diagnosis not present

## 2018-02-08 DIAGNOSIS — E559 Vitamin D deficiency, unspecified: Secondary | ICD-10-CM | POA: Diagnosis not present

## 2018-02-08 DIAGNOSIS — E782 Mixed hyperlipidemia: Secondary | ICD-10-CM | POA: Diagnosis not present

## 2018-02-08 DIAGNOSIS — C50911 Malignant neoplasm of unspecified site of right female breast: Secondary | ICD-10-CM | POA: Diagnosis not present

## 2018-03-02 DIAGNOSIS — N951 Menopausal and female climacteric states: Secondary | ICD-10-CM | POA: Diagnosis not present

## 2018-03-02 DIAGNOSIS — M810 Age-related osteoporosis without current pathological fracture: Secondary | ICD-10-CM | POA: Diagnosis not present

## 2018-03-02 DIAGNOSIS — E039 Hypothyroidism, unspecified: Secondary | ICD-10-CM | POA: Diagnosis not present

## 2018-03-02 DIAGNOSIS — R5383 Other fatigue: Secondary | ICD-10-CM | POA: Diagnosis not present

## 2018-03-29 DIAGNOSIS — Z124 Encounter for screening for malignant neoplasm of cervix: Secondary | ICD-10-CM | POA: Diagnosis not present

## 2018-03-29 DIAGNOSIS — Z1231 Encounter for screening mammogram for malignant neoplasm of breast: Secondary | ICD-10-CM | POA: Diagnosis not present

## 2018-03-29 DIAGNOSIS — Z1211 Encounter for screening for malignant neoplasm of colon: Secondary | ICD-10-CM | POA: Diagnosis not present

## 2018-03-29 DIAGNOSIS — R350 Frequency of micturition: Secondary | ICD-10-CM | POA: Diagnosis not present

## 2018-04-02 ENCOUNTER — Ambulatory Visit (HOSPITAL_COMMUNITY): Payer: Medicare Other

## 2018-04-02 ENCOUNTER — Inpatient Hospital Stay (HOSPITAL_COMMUNITY): Payer: Medicare Other | Attending: Hematology

## 2018-04-02 ENCOUNTER — Other Ambulatory Visit: Payer: Self-pay

## 2018-04-02 DIAGNOSIS — C50911 Malignant neoplasm of unspecified site of right female breast: Secondary | ICD-10-CM | POA: Diagnosis not present

## 2018-04-02 DIAGNOSIS — R911 Solitary pulmonary nodule: Secondary | ICD-10-CM

## 2018-04-02 DIAGNOSIS — Z170 Estrogen receptor positive status [ER+]: Secondary | ICD-10-CM | POA: Insufficient documentation

## 2018-04-02 DIAGNOSIS — Z9221 Personal history of antineoplastic chemotherapy: Secondary | ICD-10-CM | POA: Insufficient documentation

## 2018-04-02 DIAGNOSIS — Z923 Personal history of irradiation: Secondary | ICD-10-CM | POA: Diagnosis not present

## 2018-04-02 DIAGNOSIS — Z9011 Acquired absence of right breast and nipple: Secondary | ICD-10-CM | POA: Insufficient documentation

## 2018-04-02 LAB — COMPREHENSIVE METABOLIC PANEL
ALK PHOS: 86 U/L (ref 38–126)
ALT: 34 U/L (ref 0–44)
AST: 20 U/L (ref 15–41)
Albumin: 4.1 g/dL (ref 3.5–5.0)
Anion gap: 8 (ref 5–15)
BUN: 16 mg/dL (ref 8–23)
CALCIUM: 8.9 mg/dL (ref 8.9–10.3)
CHLORIDE: 106 mmol/L (ref 98–111)
CO2: 26 mmol/L (ref 22–32)
CREATININE: 0.7 mg/dL (ref 0.44–1.00)
GFR calc Af Amer: >60 mL/min (ref >60)
Glucose, Bld: 125 mg/dL — ABNORMAL HIGH (ref 70–99)
Potassium: 4.2 mmol/L (ref 3.5–5.1)
SODIUM: 140 mmol/L (ref 135–145)
Total Bilirubin: 0.3 mg/dL (ref 0.3–1.2)
Total Protein: 7.1 g/dL (ref 6.5–8.1)

## 2018-04-02 LAB — CBC WITH DIFFERENTIAL/PLATELET
ABS IMMATURE GRANULOCYTES: 0.01 10*3/uL (ref 0.00–0.07)
BASOS PCT: 1 %
Basophils Absolute: 0 10*3/uL (ref 0.0–0.1)
EOS PCT: 3 %
Eosinophils Absolute: 0.2 10*3/uL (ref 0.0–0.5)
HCT: 45.2 % (ref 36.0–46.0)
Hemoglobin: 14.5 g/dL (ref 12.0–15.0)
Immature Granulocytes: 0 %
Lymphocytes Relative: 31 %
Lymphs Abs: 1.5 10*3/uL (ref 0.7–4.0)
MCH: 27.9 pg (ref 26.0–34.0)
MCHC: 32.1 g/dL (ref 30.0–36.0)
MCV: 86.9 fL (ref 80.0–100.0)
MONO ABS: 0.4 10*3/uL (ref 0.1–1.0)
Monocytes Relative: 7 %
NRBC: 0 % (ref 0.0–0.2)
Neutro Abs: 2.9 10*3/uL (ref 1.7–7.7)
Neutrophils Relative %: 58 %
PLATELETS: 223 10*3/uL (ref 150–400)
RBC: 5.2 MIL/uL — AB (ref 3.87–5.11)
RDW: 12.4 % (ref 11.5–15.5)
WBC: 5 10*3/uL (ref 4.0–10.5)

## 2018-04-02 LAB — LACTATE DEHYDROGENASE: LDH: 130 U/L (ref 98–192)

## 2018-04-10 ENCOUNTER — Other Ambulatory Visit: Payer: Self-pay

## 2018-04-10 ENCOUNTER — Inpatient Hospital Stay (HOSPITAL_COMMUNITY): Payer: Medicare Other | Attending: Hematology | Admitting: Nurse Practitioner

## 2018-04-10 VITALS — BP 120/76 | HR 90 | Temp 98.2°F

## 2018-04-10 DIAGNOSIS — Z79899 Other long term (current) drug therapy: Secondary | ICD-10-CM | POA: Diagnosis not present

## 2018-04-10 DIAGNOSIS — R918 Other nonspecific abnormal finding of lung field: Secondary | ICD-10-CM | POA: Diagnosis not present

## 2018-04-10 DIAGNOSIS — Z87891 Personal history of nicotine dependence: Secondary | ICD-10-CM | POA: Insufficient documentation

## 2018-04-10 DIAGNOSIS — C50911 Malignant neoplasm of unspecified site of right female breast: Secondary | ICD-10-CM

## 2018-04-10 DIAGNOSIS — Z13820 Encounter for screening for osteoporosis: Secondary | ICD-10-CM

## 2018-04-10 NOTE — Assessment & Plan Note (Addendum)
1. Right breast cancer 2014 T1cN0: - Patient has recurrent right breast cancer.  She was originally staged as a PT1CN0 with 0 out of 3 lymph node involvement.  Invasive ductal carcinoma of the right breast, ER+/PR+/HER2-, grade 2, diagnosed and treated in 2014. - She underwent right mastectomy and SLND with Dr. Nyoka Cowden up at that time. - Oncotype DX was 22. - Chemotherapy was recommended but she only took 1 cycle of TC before declining further therapy due to side effects. - She also declined endocrine therapy at that time. - On her routine follow-up on 02/24/2015 she noticed a nodule of the right chest wall or axilla. - She had an ultrasound performed 03/04/2015 which showed an irregular 1.2 cm mass, hypoechoic centrally and hyperechoic peripherally.  Also noted were several small axillary lymph nodes but did not appear to have suspicious morphology. -On 03/03/2015 at [facility] she had bone scan, PET scan, and MRI abd/pel all which resulted negative. - She underwent a biopsy on 04/02/2015 pathology showed ER+/PR+/HER2-, invasive ductal carcinoma. -She completed 4 cycles of AC chemotherapy. - She then had a right partial mastectomy with LN dissection 07/22/2015 at [facility]. - Her mammogram on 03/29/2017 was negative for malignancy. - She had a CT CAP on 04/24/2017 that showed surgical changes from the right mastectomy.  No findings suspicious for malignancy.  Also noted a small pulmonary nodule that appeared stable and benign. -She had a bone scan on 04/24/2017 that showed no evidence of bone mets. - Her latest mammogram on 03/30/2018 was B RADS category 1 negative for malignancy.  Was reviewed with the patient today.  She will repeat in 1 year. -She is scheduled for an ultrasound of her right mastectomy site and axillary on 03/20/2018. - She has refused any medications at this time and she continues medications prescribed by her holistic doctor.  We do not recommend or oversee this current therapy. - We will have her  follow-up in 6 months with labs.  2.  Pulmonary nodules: - CT scan done on 04/24/2017 showed small pulmonary nodules that appeared stable and benign. - She was supposed to go for a follow-up CT of the chest prior to this visit. She did not make her appointment and wishes to reschedule prior to her next visit. - We will reschedule her follow-up CT prior to her next visit.  3.  Health maintenance: - Her bone density test done 09/01/2016 showed osteopenia. - It was recommended she took calcium and vitamin D twice daily. - It is recommended to repeat her bone density test in 08/2018. - We will schedule her for her bone density test prior to her next visit. - It is time for her colonoscopy and she will call the GI doctor and [place] and schedule this.

## 2018-04-10 NOTE — Patient Instructions (Signed)
[place] at [facility] Discharge Instructions  Follow up in 6 months with labs, CT, and bone density.    Thank you for choosing [place] at [facility] to provide your oncology and hematology care.  To afford each patient quality time with our provider, please arrive at least 15 minutes before your scheduled appointment time.   If you have a lab appointment with the [facility] please come in thru the  Main Entrance and check in at the main information desk  You need to re-schedule your appointment should you arrive 10 or more minutes late.  We strive to give you quality time with our providers, and arriving late affects you and other patients whose appointments are after yours.  Also, if you no show three or more times for appointments you may be dismissed from the clinic at the providers discretion.     Again, thank you for choosing [facility].  Our hope is that these requests will decrease the amount of time that you wait before being seen by our physicians.       _____________________________________________________________  Should you have questions after your visit to [facility], please contact our office at [phone] between the hours of 8:00 a.m. and 4:30 p.m.  Voicemails left after 4:00 p.m. will not be returned until the following business day.  For prescription refill requests, have your pharmacy contact our office and allow 72 hours.    Cancer Center Support Programs:   > Cancer Support Group  2nd Tuesday of the month 1pm-2pm, Journey Room

## 2018-04-10 NOTE — Progress Notes (Signed)
[place], [place] [address]   CLINIC:  Medical Oncology/Hematology  PCP:  Caryl Bis, MD [place] [phone]   REASON FOR VISIT: Follow-up for right breast cancer  CURRENT THERAPY: Observation  BRIEF ONCOLOGIC HISTORY:    Recurrent breast cancer, right ([place])   06/27/2012 Initial Diagnosis    Breast cancer, invasive ductal carcinoma with lobular features    09/12/2012 Definitive Surgery    Right mastectomy with sentinel node biopsy    09/12/2012 Pathology Results    2.1 x 2.0 cm primary tumor, grade 2, ER 90%, PR NEGATIVE, HER2 NEGATIVE, ONCOTYPE score 22    01/29/2013 - 02/19/2013 Chemotherapy    TC x 1 cycle     02/18/2013 Adverse Reaction    Patient decided to stop chemotherapy after 1 cycle due to poor tolerance with side effects.    03/11/2015 Relapse/Recurrence    Positive biopsy from right chest wall nodule.    03/25/2015 PET scan    New hypermetabolic soft tissue nodule right anterior chest wall. Artifact versus small right hepatic metastatic lesion. Consider MRI for further evaluation    04/29/2015 - 06/10/2015 Chemotherapy    Dose-dense Adriamycin/Cytoxan x 4 cycles at [facility].  Last cycle requiring 25% dose reduction due to toxicities.    07/15/2015 Breast MRI    MRI breast- Slight interval decrease in size of biopsy proven recurrence in the right chest wall.No MRI evidence of malignancy in the left breast.    07/22/2015 Procedure    Right partial mastectomy by Dr. Carvel Getting ([place])     07/22/2015 Pathology Results    Right chest wall recurrent invasive ductal carcinoma with lobular features, intermediate grade ([id number]). ER+, PR +, HER2 NEGATIVE.    09/23/2015 Concurrent Chemotherapy    XRT with Xeloda 1250 mg/m2 days 1-14 every 21 days per [facility]: CREAT-X, NEJM 2017. Jun 1;376(22):2147-2159.   XRT beginning on 9/6- 10/29/2015 Completed cycle 6 of Xeloda on 01/21/16      CANCER STAGING: Cancer  Staging Recurrent breast cancer, right ([place]) Staging form: Breast, AJCC 7th Edition - Clinical: Stage IA (T1c, N0, M0) - Signed by Baird Cancer, PA-C on 09/11/2015    INTERVAL HISTORY:  Rebecca Hensley 63 y.o. female returns for routine follow-up for recurrent breast cancer.  Patient is here alone today.  She is doing well with no complaints.  She continues to see her holistic doctor and is getting pellets for antiestrogen therapy.  She is unsure of the name of the pellets.  She is not taking calcium daily and was educated on the importance of this due to being osteopenic and she will start taking them twice daily.  She forgot about her CT of her chest and will do that with her return visit. Denies any nausea, vomiting, or diarrhea. Denies any new pains. Had not noticed any recent bleeding such as epistaxis, hematuria or hematochezia. Denies recent chest pain on exertion, shortness of breath on minimal exertion, pre-syncopal episodes, or palpitations. Denies any numbness or tingling in hands or feet. Denies any recent fevers, infections, or recent hospitalizations. Patient reports appetite at 100% and energy level at 75%.  She joined a gym and is getting exercise every other day.  She is eating well and maintaining her weight at this time    REVIEW OF SYSTEMS:  Review of Systems  Psychiatric/Behavioral: Positive for sleep disturbance.  All other systems reviewed and are negative.    PAST MEDICAL/SURGICAL  HISTORY:  Past Medical History:  Diagnosis Date  . Breast cancer ([place]) 2014  . Cancer ([place])   . Fuchs' corneal dystrophy   . Hyperlipidemia   . Personal history of chemotherapy   . Personal history of radiation therapy    Past Surgical History:  Procedure Laterality Date  . APPENDECTOMY    . ENDOSCOPIC INSERTION PERITONEAL CATHETER PORT    . EXCISION MASS NECK    . MASTECTOMY Right 2015  . NASAL SEPTUM SURGERY       SOCIAL HISTORY:  Social History   Socioeconomic History  .  Marital status: Married    Spouse name: Not on file  . Number of children: 2  . Years of education: Not on file  . Highest education level: Not on file  Occupational History    Employer: [employer]  Social Needs  . Financial resource strain: Not on file  . Food insecurity:    Worry: Not on file    Inability: Not on file  . Transportation needs:    Medical: Not on file    Non-medical: Not on file  Tobacco Use  . Smoking status: Former Smoker    Packs/day: 1.00    Years: 30.00    Pack years: 30.00    Last attempt to quit: 07/18/2002    Years since quitting: 15.7  . Smokeless tobacco: Never Used  Substance and Sexual Activity  . Alcohol use: No  . Drug use: No  . Sexual activity: Not on file  Lifestyle  . Physical activity:    Days per week: Not on file    Minutes per session: Not on file  . Stress: Not on file  Relationships  . Social connections:    Talks on phone: Not on file    Gets together: Not on file    Attends religious service: Not on file    Active member of club or organization: Not on file    Attends meetings of clubs or organizations: Not on file    Relationship status: Not on file  . Intimate partner violence:    Fear of current or ex partner: Not on file    Emotionally abused: Not on file    Physically abused: Not on file    Forced sexual activity: Not on file  Other Topics Concern  . Not on file  Social History Narrative  . Not on file    FAMILY HISTORY:  Family History  Problem Relation Age of Onset  . Breast cancer Mother 67       bilateral cancer, second diagnosis at 50  . Stomach cancer Father 69  . Breast cancer Sister 17       bilateral breast cancer, second dx at 3  . Breast cancer Maternal Aunt        dx in her 5s  . Prostate cancer Maternal Grandfather        dx in his 22s-60s  . Lung cancer Sister 42       smoker  . Breast cancer Maternal Aunt        dx in her 55s  . Breast cancer Cousin        maternal cousin  .  Breast cancer Cousin        maternal cousin  . Breast cancer Paternal Aunt 39  . Bone cancer Paternal Uncle        thinks the cancer started somewhere else  . Breast cancer Paternal Aunt  dx in her 11s  . Cancer Cousin        dx in her 58s, a "female" cancer    CURRENT MEDICATIONS:  Outpatient Encounter Medications as of 04/10/2018  Medication Sig Note  . acetaminophen (TYLENOL) 500 MG tablet Take by mouth.   . ALPRAZolam (XANAX) 0.5 MG tablet alprazolam 0.5 mg tablet  take 1 tablet by mouth four times a day   . ALPRAZolam (XANAX) 0.5 MG tablet alprazolam 0.5 mg tablet   . Ascorbic Acid (VITAMIN C) 1000 MG tablet Take 1,000 mg by mouth daily.   . busPIRone (BUSPAR) 15 MG tablet TK 1 T PO TID FOR ANXIETY   . busPIRone (BUSPAR) 15 MG tablet buspirone 15 mg tablet   . Cholecalciferol (VITAMIN D3) 5000 units TABS Take by mouth. 11/04/2015: Received from: [facility]: Take 5,000 Units by mouth daily.  . cycloSPORINE (RESTASIS) 0.05 % ophthalmic emulsion Restasis MultiDose 0.05 % eye drops   . cycloSPORINE (RESTASIS) 0.05 % ophthalmic emulsion Restasis 0.05 % eye drops in a dropperette   . ipratropium (ATROVENT) 0.06 % nasal spray ipratropium bromide 42 mcg (0.06 %) nasal spray   . liothyronine (CYTOMEL) 25 MCG tablet liothyronine 25 mcg tablet   . mirtazapine (REMERON SOL-TAB) 45 MG disintegrating tablet DIS 1 T ON THE TONGUE QPM FOR DEPRESSION   . mirtazapine (REMERON) 30 MG tablet mirtazapine 30 mg tablet   . NATURE-THROID 65 MG tablet TK 1 AND 1/2 TS PO QD   . NON FORMULARY Testosterone 166m, Anastrozole 491m Stearic acid 5.3m67mellet   . PREVIDENT 5000 DRY MOUTH 1.1 % GEL dental gel    . rosuvastatin (CRESTOR) 5 MG tablet TK 1 T PO QD   . sodium fluoride (PREVIDENT) 1.1 % GEL dental gel PreviDent 5000 Dry Mouth 1.1 % gel   . temazepam (RESTORIL) 30 MG capsule temazepam 30 mg capsule   . thyroid (NATURE-THROID) 65 MG tablet Nature-Throid 65 mg tablet  TK 1 AND  1/2 TS PO QD   . tobramycin-dexamethasone (TOBRADEX) ophthalmic solution tobramycin 0.3 %-dexamethasone 0.1 % eye drops,suspension   . venlafaxine XR (EFFEXOR-XR) 150 MG 24 hr capsule venlafaxine ER 150 mg capsule,extended release 24 hr   . vitamin A 8000 UNIT capsule Take 8,000 Units by mouth daily.   . vitamin E 400 UNIT capsule Take 400 Units by mouth daily.   . XMarland KitchenIDRA 5 % SOLN     Facility-Administered Encounter Medications as of 04/10/2018  Medication  . heparin lock flush 100 unit/mL  . sodium chloride flush (NS) 0.9 % injection 20 mL    ALLERGIES:  Allergies  Allergen Reactions  . Codeine Nausea Only  . Gabapentin Other (See Comments)     PHYSICAL EXAM:  ECOG Performance status: 1  Vitals:   04/10/18 1438  BP: 120/76  Pulse: 90  Temp: 98.2 F (36.8 C)  SpO2: 98%   There were no vitals filed for this visit.  Physical Exam Constitutional:      Appearance: Normal appearance. She is normal weight.  Cardiovascular:     Rate and Rhythm: Normal rate and regular rhythm.     Heart sounds: Normal heart sounds.  Pulmonary:     Effort: Pulmonary effort is normal.     Breath sounds: Normal breath sounds.  Abdominal:     General: Abdomen is flat. Bowel sounds are normal.     Palpations: Abdomen is soft.  Musculoskeletal: Normal range of motion.  Skin:    General: Skin is  warm and dry.  Neurological:     Mental Status: She is alert and oriented to person, place, and time. Mental status is at baseline.  Psychiatric:        Mood and Affect: Mood normal.        Behavior: Behavior normal.        Thought Content: Thought content normal.        Judgment: Judgment normal.      LABORATORY DATA:  I have reviewed the labs as listed.  CBC    Component Value Date/Time   WBC 5.0 04/02/2018 1047   RBC 5.20 (H) 04/02/2018 1047   HGB 14.5 04/02/2018 1047   HGB 12.1 11/20/2012 1509   HCT 45.2 04/02/2018 1047   HCT 36.6 11/20/2012 1509   PLT 223 04/02/2018 1047   PLT 267  11/20/2012 1509   MCV 86.9 04/02/2018 1047   MCV 85.6 11/20/2012 1509   MCH 27.9 04/02/2018 1047   MCHC 32.1 04/02/2018 1047   RDW 12.4 04/02/2018 1047   RDW 13.5 11/20/2012 1509   LYMPHSABS 1.5 04/02/2018 1047   LYMPHSABS 1.6 11/20/2012 1509   MONOABS 0.4 04/02/2018 1047   MONOABS 0.5 11/20/2012 1509   EOSABS 0.2 04/02/2018 1047   EOSABS 0.1 11/20/2012 1509   BASOSABS 0.0 04/02/2018 1047   BASOSABS 0.0 11/20/2012 1509   CMP Latest Ref Rng & Units 04/02/2018 04/24/2017 12/07/2015  Glucose 70 - 99 mg/dL 125(H) - 111(H)  BUN 8 - 23 mg/dL 16 - 13  Creatinine 0.44 - 1.00 mg/dL 0.70 0.90 0.58  Sodium 135 - 145 mmol/L 140 - 137  Potassium 3.5 - 5.1 mmol/L 4.2 - 3.8  Chloride 98 - 111 mmol/L 106 - 104  CO2 22 - 32 mmol/L 26 - 28  Calcium 8.9 - 10.3 mg/dL 8.9 - 8.9  Total Protein 6.5 - 8.1 g/dL 7.1 - 7.0  Total Bilirubin 0.3 - 1.2 mg/dL 0.3 - 0.5  Alkaline Phos 38 - 126 U/L 86 - 85  AST 15 - 41 U/L 20 - 18  ALT 0 - 44 U/L 34 - 22       DIAGNOSTIC IMAGING:  I have independently reviewed the mammogram and discussed with the patient.    I personally performed a face-to-face visit, made revisions and my assessment and plan is as follows.    ASSESSMENT & PLAN:   Recurrent breast cancer, right ([place]) 1. Right breast cancer 2014 T1cN0: - Patient has recurrent right breast cancer.  She was originally staged as a PT1CN0 with 0 out of 3 lymph node involvement.  Invasive ductal carcinoma of the right breast, ER+/PR+/HER2-, grade 2, diagnosed and treated in 2014. - She underwent right mastectomy and SLND with Dr. Nyoka Cowden up at that time. - Oncotype DX was 22. - Chemotherapy was recommended but she only took 1 cycle of TC before declining further therapy due to side effects. - She also declined endocrine therapy at that time. - On her routine follow-up on 02/24/2015 she noticed a nodule of the right chest wall or axilla. - She had an ultrasound performed 03/04/2015 which showed an irregular 1.2  cm mass, hypoechoic centrally and hyperechoic peripherally.  Also noted were several small axillary lymph nodes but did not appear to have suspicious morphology. -On 03/03/2015 at [facility] she had bone scan, PET scan, and MRI abd/pel all which resulted negative. - She underwent a biopsy on 04/02/2015 pathology showed ER+/PR+/HER2-, invasive ductal carcinoma. -She completed 4 cycles of AC chemotherapy. - She then  had a right partial mastectomy with LN dissection 07/22/2015 at [facility]. - Her mammogram on 03/29/2017 was negative for malignancy. - She had a CT CAP on 04/24/2017 that showed surgical changes from the right mastectomy.  No findings suspicious for malignancy.  Also noted a small pulmonary nodule that appeared stable and benign. -She had a bone scan on 04/24/2017 that showed no evidence of bone mets. - Her latest mammogram on 03/30/2018 was B RADS category 1 negative for malignancy.  Was reviewed with the patient today.  She will repeat in 1 year. -She is scheduled for an ultrasound of her right mastectomy site and axillary on 03/20/2018. - She has refused any medications at this time and she continues medications prescribed by her holistic doctor.  We do not recommend or oversee this current therapy. - We will have her follow-up in 6 months with labs.  2.  Pulmonary nodules: - CT scan done on 04/24/2017 showed small pulmonary nodules that appeared stable and benign. - She was supposed to go for a follow-up CT of the chest prior to this visit. She did not make her appointment and wishes to reschedule prior to her next visit. - We will reschedule her follow-up CT prior to her next visit.  3.  Health maintenance: - Her bone density test done 09/01/2016 showed osteopenia. - It was recommended she took calcium and vitamin D twice daily. - It is recommended to repeat her bone density test in 08/2018. - We will schedule her for her bone density test prior to her next visit. - It is time for her colonoscopy and she  will call the GI doctor and [place] and schedule this.      Orders placed this encounter:  Orders Placed This Encounter  Procedures  . CT Chest W Contrast  . DG Bone Density  . CBC with Differential/Platelet  . Comprehensive metabolic panel  . Ferritin  . Iron and TIBC  . VITAMIN D 25 Hydroxy (Vit-D Deficiency, Fractures)  . Vitamin B12  . Folate  . Lactate dehydrogenase     Francene Finders, FNP-C [place] [phone]

## 2018-05-07 DIAGNOSIS — G4700 Insomnia, unspecified: Secondary | ICD-10-CM | POA: Diagnosis not present

## 2018-06-08 DIAGNOSIS — C50911 Malignant neoplasm of unspecified site of right female breast: Secondary | ICD-10-CM | POA: Diagnosis not present

## 2018-06-08 DIAGNOSIS — G4733 Obstructive sleep apnea (adult) (pediatric): Secondary | ICD-10-CM | POA: Diagnosis not present

## 2018-06-08 DIAGNOSIS — E782 Mixed hyperlipidemia: Secondary | ICD-10-CM | POA: Diagnosis not present

## 2018-06-08 DIAGNOSIS — R7301 Impaired fasting glucose: Secondary | ICD-10-CM | POA: Diagnosis not present

## 2018-06-13 DIAGNOSIS — Z0001 Encounter for general adult medical examination with abnormal findings: Secondary | ICD-10-CM | POA: Diagnosis not present

## 2018-06-13 DIAGNOSIS — Z1212 Encounter for screening for malignant neoplasm of rectum: Secondary | ICD-10-CM | POA: Diagnosis not present

## 2018-06-13 DIAGNOSIS — E782 Mixed hyperlipidemia: Secondary | ICD-10-CM | POA: Diagnosis not present

## 2018-06-13 DIAGNOSIS — E559 Vitamin D deficiency, unspecified: Secondary | ICD-10-CM | POA: Diagnosis not present

## 2018-06-13 DIAGNOSIS — R7301 Impaired fasting glucose: Secondary | ICD-10-CM | POA: Diagnosis not present

## 2018-06-13 DIAGNOSIS — C50911 Malignant neoplasm of unspecified site of right female breast: Secondary | ICD-10-CM | POA: Diagnosis not present

## 2018-06-19 DIAGNOSIS — G4700 Insomnia, unspecified: Secondary | ICD-10-CM | POA: Diagnosis not present

## 2018-06-19 DIAGNOSIS — G2581 Restless legs syndrome: Secondary | ICD-10-CM | POA: Diagnosis not present

## 2018-07-30 DIAGNOSIS — H04123 Dry eye syndrome of bilateral lacrimal glands: Secondary | ICD-10-CM | POA: Diagnosis not present

## 2018-07-30 DIAGNOSIS — H2513 Age-related nuclear cataract, bilateral: Secondary | ICD-10-CM | POA: Diagnosis not present

## 2018-07-30 DIAGNOSIS — D3131 Benign neoplasm of right choroid: Secondary | ICD-10-CM | POA: Diagnosis not present

## 2018-07-30 DIAGNOSIS — H00025 Hordeolum internum left lower eyelid: Secondary | ICD-10-CM | POA: Diagnosis not present

## 2018-09-10 DIAGNOSIS — N951 Menopausal and female climacteric states: Secondary | ICD-10-CM | POA: Diagnosis not present

## 2018-09-10 DIAGNOSIS — E039 Hypothyroidism, unspecified: Secondary | ICD-10-CM | POA: Diagnosis not present

## 2018-09-10 DIAGNOSIS — M810 Age-related osteoporosis without current pathological fracture: Secondary | ICD-10-CM | POA: Diagnosis not present

## 2018-10-04 ENCOUNTER — Ambulatory Visit (HOSPITAL_COMMUNITY): Payer: Medicare Other

## 2018-10-04 ENCOUNTER — Other Ambulatory Visit (HOSPITAL_COMMUNITY): Payer: Medicare Other

## 2018-10-04 ENCOUNTER — Inpatient Hospital Stay (HOSPITAL_COMMUNITY): Payer: Medicare Other

## 2018-10-08 ENCOUNTER — Inpatient Hospital Stay (HOSPITAL_COMMUNITY): Payer: Medicare Other | Attending: Hematology

## 2018-10-08 ENCOUNTER — Other Ambulatory Visit: Payer: Self-pay

## 2018-10-08 DIAGNOSIS — Z9011 Acquired absence of right breast and nipple: Secondary | ICD-10-CM | POA: Diagnosis not present

## 2018-10-08 DIAGNOSIS — Z79899 Other long term (current) drug therapy: Secondary | ICD-10-CM | POA: Insufficient documentation

## 2018-10-08 DIAGNOSIS — Z803 Family history of malignant neoplasm of breast: Secondary | ICD-10-CM | POA: Insufficient documentation

## 2018-10-08 DIAGNOSIS — R918 Other nonspecific abnormal finding of lung field: Secondary | ICD-10-CM | POA: Insufficient documentation

## 2018-10-08 DIAGNOSIS — F329 Major depressive disorder, single episode, unspecified: Secondary | ICD-10-CM | POA: Insufficient documentation

## 2018-10-08 DIAGNOSIS — Z87891 Personal history of nicotine dependence: Secondary | ICD-10-CM | POA: Insufficient documentation

## 2018-10-08 DIAGNOSIS — Z9221 Personal history of antineoplastic chemotherapy: Secondary | ICD-10-CM | POA: Insufficient documentation

## 2018-10-08 DIAGNOSIS — C50911 Malignant neoplasm of unspecified site of right female breast: Secondary | ICD-10-CM

## 2018-10-08 DIAGNOSIS — Z853 Personal history of malignant neoplasm of breast: Secondary | ICD-10-CM | POA: Diagnosis not present

## 2018-10-08 DIAGNOSIS — Z801 Family history of malignant neoplasm of trachea, bronchus and lung: Secondary | ICD-10-CM | POA: Insufficient documentation

## 2018-10-08 DIAGNOSIS — Z923 Personal history of irradiation: Secondary | ICD-10-CM | POA: Diagnosis not present

## 2018-10-08 DIAGNOSIS — E785 Hyperlipidemia, unspecified: Secondary | ICD-10-CM | POA: Insufficient documentation

## 2018-10-08 DIAGNOSIS — M8580 Other specified disorders of bone density and structure, unspecified site: Secondary | ICD-10-CM | POA: Diagnosis not present

## 2018-10-08 LAB — CBC WITH DIFFERENTIAL/PLATELET
Abs Immature Granulocytes: 0.01 10*3/uL (ref 0.00–0.07)
Basophils Absolute: 0 10*3/uL (ref 0.0–0.1)
Basophils Relative: 0 %
Eosinophils Absolute: 0.1 10*3/uL (ref 0.0–0.5)
Eosinophils Relative: 1 %
HCT: 44.9 % (ref 36.0–46.0)
Hemoglobin: 14.6 g/dL (ref 12.0–15.0)
Immature Granulocytes: 0 %
Lymphocytes Relative: 25 %
Lymphs Abs: 1.4 10*3/uL (ref 0.7–4.0)
MCH: 28.3 pg (ref 26.0–34.0)
MCHC: 32.5 g/dL (ref 30.0–36.0)
MCV: 87.2 fL (ref 80.0–100.0)
Monocytes Absolute: 0.4 10*3/uL (ref 0.1–1.0)
Monocytes Relative: 7 %
Neutro Abs: 3.6 10*3/uL (ref 1.7–7.7)
Neutrophils Relative %: 67 %
Platelets: 252 10*3/uL (ref 150–400)
RBC: 5.15 MIL/uL — ABNORMAL HIGH (ref 3.87–5.11)
RDW: 12 % (ref 11.5–15.5)
WBC: 5.5 10*3/uL (ref 4.0–10.5)
nRBC: 0 % (ref 0.0–0.2)

## 2018-10-08 LAB — FOLATE: Folate: 12.6 ng/mL (ref >5.9)

## 2018-10-08 LAB — COMPREHENSIVE METABOLIC PANEL
ALT: 24 U/L (ref 0–44)
AST: 16 U/L (ref 15–41)
Albumin: 4.1 g/dL (ref 3.5–5.0)
Alkaline Phosphatase: 116 U/L (ref 38–126)
Anion gap: 10 (ref 5–15)
BUN: 10 mg/dL (ref 8–23)
CO2: 27 mmol/L (ref 22–32)
Calcium: 9.1 mg/dL (ref 8.9–10.3)
Chloride: 103 mmol/L (ref 98–111)
Creatinine, Ser: 0.62 mg/dL (ref 0.44–1.00)
GFR calc Af Amer: >60 mL/min (ref >60)
GFR calc non Af Amer: >60 mL/min (ref >60)
Glucose, Bld: 126 mg/dL — ABNORMAL HIGH (ref 70–99)
Potassium: 4.3 mmol/L (ref 3.5–5.1)
Sodium: 140 mmol/L (ref 135–145)
Total Bilirubin: 0.6 mg/dL (ref 0.3–1.2)
Total Protein: 7.4 g/dL (ref 6.5–8.1)

## 2018-10-08 LAB — IRON AND TIBC
Iron: 82 ug/dL (ref 28–170)
Saturation Ratios: 19 % (ref 10.4–31.8)
TIBC: 433 ug/dL (ref 250–450)
UIBC: 351 ug/dL

## 2018-10-08 LAB — VITAMIN B12: Vitamin B-12: 647 pg/mL (ref 180–914)

## 2018-10-08 LAB — FERRITIN: Ferritin: 79 ng/mL (ref 11–307)

## 2018-10-08 LAB — LACTATE DEHYDROGENASE: LDH: 116 U/L (ref 98–192)

## 2018-10-09 LAB — VITAMIN D 25 HYDROXY (VIT D DEFICIENCY, FRACTURES): Vit D, 25-Hydroxy: 39.7 ng/mL (ref 30.0–100.0)

## 2018-10-11 ENCOUNTER — Inpatient Hospital Stay (HOSPITAL_COMMUNITY): Payer: Medicare Other | Admitting: Nurse Practitioner

## 2018-10-11 ENCOUNTER — Other Ambulatory Visit: Payer: Self-pay

## 2018-10-11 VITALS — BP 117/69 | HR 88 | Temp 97.2°F | Resp 18 | Wt 178.3 lb

## 2018-10-11 DIAGNOSIS — Z853 Personal history of malignant neoplasm of breast: Secondary | ICD-10-CM | POA: Diagnosis not present

## 2018-10-11 DIAGNOSIS — C50911 Malignant neoplasm of unspecified site of right female breast: Secondary | ICD-10-CM

## 2018-10-11 DIAGNOSIS — E785 Hyperlipidemia, unspecified: Secondary | ICD-10-CM | POA: Diagnosis not present

## 2018-10-11 DIAGNOSIS — Z87891 Personal history of nicotine dependence: Secondary | ICD-10-CM | POA: Diagnosis not present

## 2018-10-11 DIAGNOSIS — Z1231 Encounter for screening mammogram for malignant neoplasm of breast: Secondary | ICD-10-CM

## 2018-10-11 DIAGNOSIS — M8580 Other specified disorders of bone density and structure, unspecified site: Secondary | ICD-10-CM | POA: Diagnosis not present

## 2018-10-11 DIAGNOSIS — Z801 Family history of malignant neoplasm of trachea, bronchus and lung: Secondary | ICD-10-CM | POA: Diagnosis not present

## 2018-10-11 DIAGNOSIS — Z9221 Personal history of antineoplastic chemotherapy: Secondary | ICD-10-CM | POA: Diagnosis not present

## 2018-10-11 DIAGNOSIS — Z9011 Acquired absence of right breast and nipple: Secondary | ICD-10-CM | POA: Diagnosis not present

## 2018-10-11 DIAGNOSIS — Z79899 Other long term (current) drug therapy: Secondary | ICD-10-CM | POA: Diagnosis not present

## 2018-10-11 DIAGNOSIS — R918 Other nonspecific abnormal finding of lung field: Secondary | ICD-10-CM | POA: Diagnosis not present

## 2018-10-11 DIAGNOSIS — Z803 Family history of malignant neoplasm of breast: Secondary | ICD-10-CM | POA: Diagnosis not present

## 2018-10-11 DIAGNOSIS — Z923 Personal history of irradiation: Secondary | ICD-10-CM | POA: Diagnosis not present

## 2018-10-11 NOTE — Assessment & Plan Note (Addendum)
1. Right breast cancer 2014 T1cN0: - Patient has recurrent right breast cancer.  She was originally staged as a PT1CN0 with 0 out of 3 lymph node involvement.  Invasive ductal carcinoma of the right breast, ER+/PR+/HER2-, grade 2, diagnosed and treated in 2014. - She underwent right mastectomy and SLND with Dr. Nyoka Cowden up at that time. - Oncotype DX was 22. - Chemotherapy was recommended but she only took 1 cycle of TC before declining further therapy due to side effects. - She also declined endocrine therapy at that time. - On her routine follow-up on 02/24/2015 she noticed a nodule of the right chest wall or axilla. - She had an ultrasound performed 03/04/2015 which showed an irregular 1.2 cm mass, hypoechoic centrally and hyperechoic peripherally.  Also noted were several small axillary lymph nodes but did not appear to have suspicious morphology. -On 03/03/2015 at [facility] she had bone scan, PET scan, and MRI abd/pel all which resulted negative. - She underwent a biopsy on 04/02/2015 pathology showed ER+/PR+/HER2-, invasive ductal carcinoma. -She completed 4 cycles of AC chemotherapy. - She then had a right partial mastectomy with LN dissection 07/22/2015 at [facility]. - Her mammogram on 03/29/2017 was negative for malignancy. - She had a CT CAP on 04/24/2017 that showed surgical changes from the right mastectomy.  No findings suspicious for malignancy.  Also noted a small pulmonary nodule that appeared stable and benign. -She had a bone scan on 04/24/2017 that showed no evidence of bone mets. - Her latest mammogram on 03/30/2018 was B RADS category 1 negative for malignancy.  Was reviewed with the patient today.  She will repeat in 1 year. -She is scheduled for an ultrasound of her right mastectomy site and axillary on 03/20/2019. - She has refused any medications at this time and she continues medications prescribed by her holistic doctor.  We do not recommend or oversee this current therapy. - We will have her  follow-up in 6 months with labs and her yearly mammogram.  2.  Pulmonary nodules: - CT scan done on 04/24/2017 showed small pulmonary nodules that appeared stable and benign. - She was supposed to go for a follow-up CT of the chest prior to this visit. She did not make her appointment and wishes to reschedule prior to her next visit.   -She has canceled her CT of her chest for the second time.  I have reeducated her on the importance of following up and she agreed to do it. - We will reschedule her follow-up CT prior to her next visit.  3.  Health maintenance: - Her bone density test done 09/01/2016 showed osteopenia. - It was recommended she took calcium and vitamin D twice daily. - It is recommended to repeat her bone density test in 08/2018. -She canceled her bone density test. - We will schedule her for her bone density test prior to her next visit. - It is time for her colonoscopy and she will call the GI doctor and [facility] and schedule this.

## 2018-10-11 NOTE — Progress Notes (Signed)
Rebecca Hensley, [place] [address]   CLINIC:  Medical Oncology/Hematology  PCP:  Caryl Bis, MD [address] [place] [address] [phone]   REASON FOR VISIT: Follow-up for right breast cancer  CURRENT THERAPY: Surveillance per NCCN guidelines  BRIEF ONCOLOGIC HISTORY:  Oncology History  Recurrent breast cancer, right ([place])  06/27/2012 Initial Diagnosis   Breast cancer, invasive ductal carcinoma with lobular features   09/12/2012 Definitive Surgery   Right mastectomy with sentinel node biopsy   09/12/2012 Pathology Results   2.1 x 2.0 cm primary tumor, grade 2, ER 90%, PR NEGATIVE, HER2 NEGATIVE, ONCOTYPE score 22   01/29/2013 - 02/19/2013 Chemotherapy   TC x 1 cycle    02/18/2013 Adverse Reaction   Patient decided to stop chemotherapy after 1 cycle due to poor tolerance with side effects.   03/11/2015 Relapse/Recurrence   Positive biopsy from right chest wall nodule.   03/25/2015 PET scan   New hypermetabolic soft tissue nodule right anterior chest wall. Artifact versus small right hepatic metastatic lesion. Consider MRI for further evaluation   04/29/2015 - 06/10/2015 Chemotherapy   Dose-dense Adriamycin/Cytoxan x 4 cycles at [facility].  Last cycle requiring 25% dose reduction due to toxicities.   07/15/2015 Breast MRI   MRI breast- Slight interval decrease in size of biopsy proven recurrence in the right chest wall.No MRI evidence of malignancy in the left breast.   07/22/2015 Procedure   Right partial mastectomy by Dr. Carvel Getting ([place])    07/22/2015 Pathology Results   Right chest wall recurrent invasive ductal carcinoma with lobular features, intermediate grade ([id number]). ER+, PR +, HER2 NEGATIVE.   09/23/2015 Concurrent Chemotherapy   XRT with Xeloda 1250 mg/m2 days 1-14 every 21 days per [facility]: CREAT-X, NEJM 2017. Jun 1;376(22):2147-2159.   XRT beginning on 9/6- 10/29/2015 Completed cycle 6 of Xeloda on 01/21/16      CANCER STAGING:  Cancer Staging Recurrent breast cancer, right ([place]) Staging form: Breast, AJCC 7th Edition - Clinical: Stage IA (T1c, N0, M0) - Signed by Baird [facility], PA-C on 09/11/2015    INTERVAL HISTORY:  Rebecca Hensley 63 y.o. female returns for routine follow-up for right breast cancer.  She reports she has been doing well since her last visit she does struggle with anxiety and depression from time to time.  Otherwise she is doing well.  She is concerned about her right axillary lymph node dissection site.  She is requesting an ultrasound due to it feeling firmer. Denies any nausea, vomiting, or diarrhea. Denies any new pains. Had not noticed any recent bleeding such as epistaxis, hematuria or hematochezia. Denies recent chest pain on exertion, shortness of breath on minimal exertion, pre-syncopal episodes, or palpitations. Denies any numbness or tingling in hands or feet. Denies any recent fevers, infections, or recent hospitalizations. Patient reports appetite at 100% and energy level at 50%.  She is eating well maintaining her weight at this time.    REVIEW OF SYSTEMS:  Review of Systems  Psychiatric/Behavioral: Positive for depression. The patient is nervous/anxious.   All other systems reviewed and are negative.    PAST MEDICAL/SURGICAL HISTORY:  Past Medical History:  Diagnosis Date  . Breast cancer ([place]) 2014  . Cancer ([place])   . Fuchs' corneal dystrophy   . Hyperlipidemia   . Personal history of chemotherapy   . Personal history of radiation therapy    Past Surgical History:  Procedure Laterality Date  . APPENDECTOMY    .  ENDOSCOPIC INSERTION PERITONEAL CATHETER PORT    . EXCISION MASS NECK    . MASTECTOMY Right 2015  . NASAL SEPTUM SURGERY       SOCIAL HISTORY:  Social History   Socioeconomic History  . Marital status: Married    Spouse name: Not on file  . Number of children: 2  . Years of education: Not on file  . Highest education level: Not on file  Occupational  History    Employer: [employer]  Social Needs  . Financial resource strain: Not on file  . Food insecurity    Worry: Not on file    Inability: Not on file  . Transportation needs    Medical: Not on file    Non-medical: Not on file  Tobacco Use  . Smoking status: Former Smoker    Packs/day: 1.00    Years: 30.00    Pack years: 30.00    Quit date: 07/18/2002    Years since quitting: 16.2  . Smokeless tobacco: Never Used  Substance and Sexual Activity  . Alcohol use: No  . Drug use: No  . Sexual activity: Not on file  Lifestyle  . Physical activity    Days per week: Not on file    Minutes per session: Not on file  . Stress: Not on file  Relationships  . Social Herbalist on phone: Not on file    Gets together: Not on file    Attends religious service: Not on file    Active member of club or organization: Not on file    Attends meetings of clubs or organizations: Not on file    Relationship status: Not on file  . Intimate partner violence    Fear of current or ex partner: Not on file    Emotionally abused: Not on file    Physically abused: Not on file    Forced sexual activity: Not on file  Other Topics Concern  . Not on file  Social History Narrative  . Not on file    FAMILY HISTORY:  Family History  Problem Relation Age of Onset  . Breast cancer Mother 68       bilateral cancer, second diagnosis at 77  . Stomach cancer Father 47  . Breast cancer Sister 36       bilateral breast cancer, second dx at 47  . Breast cancer Maternal Aunt        dx in her 36s  . Prostate cancer Maternal Grandfather        dx in his 30s-60s  . Lung cancer Sister 62       smoker  . Breast cancer Maternal Aunt        dx in her 25s  . Breast cancer Cousin        maternal cousin  . Breast cancer Cousin        maternal cousin  . Breast cancer Paternal Aunt 68  . Bone cancer Paternal Uncle        thinks the cancer started somewhere else  . Breast cancer Paternal  Aunt        dx in her 19s  . Cancer Cousin        dx in her 29s, a "female" cancer    CURRENT MEDICATIONS:  Outpatient Encounter Medications as of 10/11/2018  Medication Sig Note  . Cholecalciferol (VITAMIN D3) 5000 units TABS Take by mouth. 11/04/2015: Received from: [facility]: Take 5,000 Units  by mouth daily.  . cycloSPORINE (RESTASIS) 0.05 % ophthalmic emulsion Place 1 drop into both eyes 2 (two) times daily.    . DULoxetine (CYMBALTA) 20 MG capsule Take 150 mg by mouth daily.   Marland Kitchen NATURE-THROID 65 MG tablet Take 65 mg by mouth daily.    . rosuvastatin (CRESTOR) 5 MG tablet Take 5 mg by mouth daily.    Marland Kitchen venlafaxine XR (EFFEXOR-XR) 150 MG 24 hr capsule venlafaxine ER 150 mg capsule,extended release 24 hr   . vitamin A 8000 UNIT capsule Take 8,000 Units by mouth daily.   Marland Kitchen acetaminophen (TYLENOL) 500 MG tablet Take by mouth.   . NON FORMULARY Testosterone 158m, Anastrozole 446m Stearic acid 5.37m23mellet   . [DISCONTINUED] ALPRAZolam (XANAX) 0.5 MG tablet alprazolam 0.5 mg tablet  take 1 tablet by mouth four times a day   . [DISCONTINUED] ALPRAZolam (XANAX) 0.5 MG tablet alprazolam 0.5 mg tablet   . [DISCONTINUED] Ascorbic Acid (VITAMIN C) 1000 MG tablet Take 1,000 mg by mouth daily.   . [DISCONTINUED] busPIRone (BUSPAR) 15 MG tablet TK 1 T PO TID FOR ANXIETY   . [DISCONTINUED] busPIRone (BUSPAR) 15 MG tablet buspirone 15 mg tablet   . [DISCONTINUED] cycloSPORINE (RESTASIS) 0.05 % ophthalmic emulsion Restasis 0.05 % eye drops in a dropperette   . [DISCONTINUED] ipratropium (ATROVENT) 0.06 % nasal spray ipratropium bromide 42 mcg (0.06 %) nasal spray   . [DISCONTINUED] liothyronine (CYTOMEL) 25 MCG tablet liothyronine 25 mcg tablet   . [DISCONTINUED] mirtazapine (REMERON SOL-TAB) 45 MG disintegrating tablet DIS 1 T ON THE TONGUE QPM FOR DEPRESSION   . [DISCONTINUED] mirtazapine (REMERON) 30 MG tablet mirtazapine 30 mg tablet   . [DISCONTINUED] PREVIDENT 5000 DRY MOUTH 1.1  % GEL dental gel    . [DISCONTINUED] sodium fluoride (PREVIDENT) 1.1 % GEL dental gel PreviDent 5000 Dry Mouth 1.1 % gel   . [DISCONTINUED] temazepam (RESTORIL) 30 MG capsule temazepam 30 mg capsule   . [DISCONTINUED] thyroid (NATURE-THROID) 65 MG tablet Take 65 mg by mouth daily.    . [DISCONTINUED] tobramycin-dexamethasone (TOBRADEX) ophthalmic solution tobramycin 0.3 %-dexamethasone 0.1 % eye drops,suspension   . [DISCONTINUED] vitamin E 400 UNIT capsule Take 400 Units by mouth daily.   . [DISCONTINUED] XIIDRA 5 % SOLN     Facility-Administered Encounter Medications as of 10/11/2018  Medication  . heparin lock flush 100 unit/mL  . sodium chloride flush (NS) 0.9 % injection 20 mL    ALLERGIES:  Allergies  Allergen Reactions  . Codeine Nausea Only  . Gabapentin Other (See Comments)  . Other      PHYSICAL EXAM:  ECOG Performance status: 1  Vitals:   10/11/18 1319  BP: 117/69  Pulse: 88  Resp: 18  Temp: (!) 97.2 F (36.2 C)  SpO2: 95%   Filed Weights   10/11/18 1319  Weight: 178 lb 4.8 oz (80.9 kg)    Physical Exam Constitutional:      Appearance: Normal appearance. She is normal weight.  Cardiovascular:     Rate and Rhythm: Normal rate and regular rhythm.     Heart sounds: Normal heart sounds.  Pulmonary:     Effort: Pulmonary effort is normal.     Breath sounds: Normal breath sounds.  Abdominal:     General: Bowel sounds are normal.     Palpations: Abdomen is soft.  Musculoskeletal: Normal range of motion.  Skin:    General: Skin is warm and dry.  Neurological:     Mental Status: She is  alert and oriented to person, place, and time. Mental status is at baseline.  Psychiatric:        Mood and Affect: Mood normal.        Behavior: Behavior normal.        Thought Content: Thought content normal.        Judgment: Judgment normal.      LABORATORY DATA:  I have reviewed the labs as listed.  CBC    Component Value Date/Time   WBC 5.5 10/08/2018 1116    RBC 5.15 (H) 10/08/2018 1116   HGB 14.6 10/08/2018 1116   HGB 12.1 11/20/2012 1509   HCT 44.9 10/08/2018 1116   HCT 36.6 11/20/2012 1509   PLT 252 10/08/2018 1116   PLT 267 11/20/2012 1509   MCV 87.2 10/08/2018 1116   MCV 85.6 11/20/2012 1509   MCH 28.3 10/08/2018 1116   MCHC 32.5 10/08/2018 1116   RDW 12.0 10/08/2018 1116   RDW 13.5 11/20/2012 1509   LYMPHSABS 1.4 10/08/2018 1116   LYMPHSABS 1.6 11/20/2012 1509   MONOABS 0.4 10/08/2018 1116   MONOABS 0.5 11/20/2012 1509   EOSABS 0.1 10/08/2018 1116   EOSABS 0.1 11/20/2012 1509   BASOSABS 0.0 10/08/2018 1116   BASOSABS 0.0 11/20/2012 1509   CMP Latest Ref Rng & Units 10/08/2018 04/02/2018 04/24/2017  Glucose 70 - 99 mg/dL 126(H) 125(H) -  BUN 8 - 23 mg/dL 10 16 -  Creatinine 0.44 - 1.00 mg/dL 0.62 0.70 0.90  Sodium 135 - 145 mmol/L 140 140 -  Potassium 3.5 - 5.1 mmol/L 4.3 4.2 -  Chloride 98 - 111 mmol/L 103 106 -  CO2 22 - 32 mmol/L 27 26 -  Calcium 8.9 - 10.3 mg/dL 9.1 8.9 -  Total Protein 6.5 - 8.1 g/dL 7.4 7.1 -  Total Bilirubin 0.3 - 1.2 mg/dL 0.6 0.3 -  Alkaline Phos 38 - 126 U/L 116 86 -  AST 15 - 41 U/L 16 20 -  ALT 0 - 44 U/L 24 34 -   I personally performed a face-to-face visit.  All questions were answered to patient's stated satisfaction. Encouraged patient to call with any new concerns or questions before his next visit to the cancer center and we can certain see him sooner, if needed.     ASSESSMENT & PLAN:   Recurrent breast cancer, right ([place]) 1. Right breast cancer 2014 T1cN0: - Patient has recurrent right breast cancer.  She was originally staged as a PT1CN0 with 0 out of 3 lymph node involvement.  Invasive ductal carcinoma of the right breast, ER+/PR+/HER2-, grade 2, diagnosed and treated in 2014. - She underwent right mastectomy and SLND with Dr. Nyoka Cowden up at that time. - Oncotype DX was 22. - Chemotherapy was recommended but she only took 1 cycle of TC before declining further therapy due to side  effects. - She also declined endocrine therapy at that time. - On her routine follow-up on 02/24/2015 she noticed a nodule of the right chest wall or axilla. - She had an ultrasound performed 03/04/2015 which showed an irregular 1.2 cm mass, hypoechoic centrally and hyperechoic peripherally.  Also noted were several small axillary lymph nodes but did not appear to have suspicious morphology. -On 03/03/2015 at [facility] she had bone scan, PET scan, and MRI abd/pel all which resulted negative. - She underwent a biopsy on 04/02/2015 pathology showed ER+/PR+/HER2-, invasive ductal carcinoma. -She completed 4 cycles of AC chemotherapy. - She then had a right partial mastectomy with LN  dissection 07/22/2015 at [facility]. - Her mammogram on 03/29/2017 was negative for malignancy. - She had a CT CAP on 04/24/2017 that showed surgical changes from the right mastectomy.  No findings suspicious for malignancy.  Also noted a small pulmonary nodule that appeared stable and benign. -She had a bone scan on 04/24/2017 that showed no evidence of bone mets. - Her latest mammogram on 03/30/2018 was B RADS category 1 negative for malignancy.  Was reviewed with the patient today.  She will repeat in 1 year. -She is scheduled for an ultrasound of her right mastectomy site and axillary on 03/20/2019. - She has refused any medications at this time and she continues medications prescribed by her holistic doctor.  We do not recommend or oversee this current therapy. - We will have her follow-up in 6 months with labs and her yearly mammogram.  2.  Pulmonary nodules: - CT scan done on 04/24/2017 showed small pulmonary nodules that appeared stable and benign. - She was supposed to go for a follow-up CT of the chest prior to this visit. She did not make her appointment and wishes to reschedule prior to her next visit.   -She has canceled her CT of her chest for the second time.  I have reeducated her on the importance of following up and she agreed to  do it. - We will reschedule her follow-up CT prior to her next visit.  3.  Health maintenance: - Her bone density test done 09/01/2016 showed osteopenia. - It was recommended she took calcium and vitamin D twice daily. - It is recommended to repeat her bone density test in 08/2018. -She canceled her bone density test. - We will schedule her for her bone density test prior to her next visit. - It is time for her colonoscopy and she will call the GI doctor and [facility] and schedule this.      Orders placed this encounter:  Orders Placed This Encounter  Procedures  . US Breast Limited Uni Right Inc Axilla  . MM DIAG BREAST TOMO UNI LEFT  . Lactate dehydrogenase  . CBC with Differential/Platelet  . Comprehensive metabolic panel  . Vitamin B12  . VITAMIN D 25 Hydroxy (Vit-D Deficiency, Fractures)      Francene Finders, FNP-C [facility] [phone]

## 2018-10-11 NOTE — Patient Instructions (Signed)
[facility] Discharge Instructions  Follow up in 6 months with labs and mammogram   Thank you for choosing [facility] at [facility] to provide your oncology and hematology care.  To afford each patient quality time with our provider, please arrive at least 15 minutes before your scheduled appointment time.   If you have a lab appointment with the Cancer Center please come in thru the Main Entrance and check in at the main information desk.  You need to re-schedule your appointment should you arrive 10 or more minutes late.  We strive to give you quality time with our providers, and arriving late affects you and other patients whose appointments are after yours.  Also, if you no show three or more times for appointments you may be dismissed from the clinic at the providers discretion.     Again, thank you for choosing [facility].  Our hope is that these requests will decrease the amount of time that you wait before being seen by our physicians.       _____________________________________________________________  Should you have questions after your visit to [facility], please contact our office at [phone] between the hours of 8:00 a.m. and 4:30 p.m.  Voicemails left after 4:00 p.m. will not be returned until the following business day.  For prescription refill requests, have your pharmacy contact our office and allow 72 hours.    Due to Covid, you will need to wear a mask upon entering the hospital. If you do not have a mask, a mask will be given to you at the Main Entrance upon arrival. For doctor visits, patients may have 1 support person with them. For treatment visits, patients can not have anyone with them due to social distancing guidelines and our immunocompromised population.      

## 2018-10-16 DIAGNOSIS — N951 Menopausal and female climacteric states: Secondary | ICD-10-CM | POA: Diagnosis not present

## 2018-10-16 DIAGNOSIS — M810 Age-related osteoporosis without current pathological fracture: Secondary | ICD-10-CM | POA: Diagnosis not present

## 2018-10-25 ENCOUNTER — Other Ambulatory Visit: Payer: Self-pay

## 2018-10-25 ENCOUNTER — Ambulatory Visit (HOSPITAL_COMMUNITY)
Admission: RE | Admit: 2018-10-25 | Discharge: 2018-10-25 | Disposition: A | Discharge status: Home / Self Care | Payer: Medicare Other | Source: Ambulatory Visit | Attending: Nurse Practitioner | Admitting: Nurse Practitioner

## 2018-10-25 DIAGNOSIS — C50911 Malignant neoplasm of unspecified site of right female breast: Secondary | ICD-10-CM | POA: Insufficient documentation

## 2018-10-25 DIAGNOSIS — R918 Other nonspecific abnormal finding of lung field: Secondary | ICD-10-CM | POA: Diagnosis not present

## 2018-10-25 MED ORDER — IOHEXOL 300 MG/ML  SOLN
75.0000 mL | Freq: Once | INTRAMUSCULAR | Status: AC | PRN
  Administered 2018-10-25: 75 mL via INTRAVENOUS

## 2018-11-27 ENCOUNTER — Telehealth (HOSPITAL_COMMUNITY): Payer: Self-pay | Admitting: *Deleted

## 2018-11-27 DIAGNOSIS — M25561 Pain in right knee: Secondary | ICD-10-CM | POA: Diagnosis not present

## 2018-11-27 DIAGNOSIS — R590 Localized enlarged lymph nodes: Secondary | ICD-10-CM | POA: Diagnosis not present

## 2018-11-27 DIAGNOSIS — C50911 Malignant neoplasm of unspecified site of right female breast: Secondary | ICD-10-CM | POA: Diagnosis not present

## 2018-11-27 NOTE — Telephone Encounter (Signed)
Pt called into clinic stating she has a knot on the side of neck and wanted to know if she should be seen at the clinic. I spoke with Reynolds Bowl, NP and she recommended the pt make an appointment with her PCP. Called pt back and pt verbalized understanding.

## 2018-11-30 DIAGNOSIS — L040 Acute lymphadenitis of face, head and neck: Secondary | ICD-10-CM | POA: Diagnosis not present

## 2018-11-30 DIAGNOSIS — R590 Localized enlarged lymph nodes: Secondary | ICD-10-CM | POA: Diagnosis not present

## 2018-12-06 ENCOUNTER — Ambulatory Visit (HOSPITAL_COMMUNITY): Payer: Medicare Other | Admitting: Hematology

## 2018-12-17 DIAGNOSIS — C7951 Secondary malignant neoplasm of bone: Secondary | ICD-10-CM | POA: Diagnosis not present

## 2018-12-17 DIAGNOSIS — C50811 Malignant neoplasm of overlapping sites of right female breast: Secondary | ICD-10-CM | POA: Diagnosis not present

## 2018-12-17 DIAGNOSIS — R590 Localized enlarged lymph nodes: Secondary | ICD-10-CM | POA: Diagnosis not present

## 2018-12-20 ENCOUNTER — Other Ambulatory Visit (HOSPITAL_COMMUNITY): Payer: Self-pay | Admitting: Oncology

## 2018-12-20 ENCOUNTER — Other Ambulatory Visit: Payer: Self-pay | Admitting: Oncology

## 2018-12-20 DIAGNOSIS — R590 Localized enlarged lymph nodes: Secondary | ICD-10-CM

## 2018-12-20 DIAGNOSIS — C7951 Secondary malignant neoplasm of bone: Secondary | ICD-10-CM

## 2018-12-20 DIAGNOSIS — C50811 Malignant neoplasm of overlapping sites of right female breast: Secondary | ICD-10-CM

## 2018-12-21 ENCOUNTER — Encounter (HOSPITAL_COMMUNITY): Payer: Self-pay

## 2018-12-21 DIAGNOSIS — R590 Localized enlarged lymph nodes: Secondary | ICD-10-CM | POA: Diagnosis not present

## 2018-12-21 DIAGNOSIS — M25559 Pain in unspecified hip: Secondary | ICD-10-CM | POA: Diagnosis not present

## 2018-12-21 DIAGNOSIS — C7951 Secondary malignant neoplasm of bone: Secondary | ICD-10-CM | POA: Diagnosis not present

## 2018-12-21 DIAGNOSIS — R599 Enlarged lymph nodes, unspecified: Secondary | ICD-10-CM | POA: Diagnosis not present

## 2018-12-21 DIAGNOSIS — C801 Malignant (primary) neoplasm, unspecified: Secondary | ICD-10-CM | POA: Diagnosis not present

## 2018-12-21 DIAGNOSIS — C50811 Malignant neoplasm of overlapping sites of right female breast: Secondary | ICD-10-CM | POA: Diagnosis not present

## 2018-12-21 NOTE — Progress Notes (Signed)
Rebecca Hensley Female, 63 y.o., 1955/09/20 MRN:  [id number] Phone:  [phone] (H) PCP:  Caryl Bis, MD Primary Cvg:  [place] [employer] Next Appt With Radiology ([facility] PET) 12/28/2018 at 8:00 AM  RE: Biopsy Received: Today Message Contents  Sandi Mariscal, MD  [facility] E  US guided R supraclavicular/lower Cx LN Bx (Neck CT - 11/13 image 58, series 2).   Sedation per pt request.   Cathren Harsh   Previous Messages  ----- Message -----  From: Lenore Cordia  Sent: 12/20/2018  3:40 PM EST  To: [facility] Procedure Requests  Subject: Biopsy                      Procedure Requested: [place] Needle Guidance Biopsy    Reason for Procedure: Right Supraclavicular LN    Provider Requesting: Silvestre Mesi, MD  Provider Telephone: [phone]    Other Info: Rad exam in Epic , Future Exam: [facility] Pet December 11th, 2020

## 2018-12-26 ENCOUNTER — Other Ambulatory Visit: Payer: Self-pay | Admitting: Physician Assistant

## 2018-12-27 ENCOUNTER — Ambulatory Visit (HOSPITAL_COMMUNITY)
Admission: RE | Admit: 2018-12-27 | Discharge: 2018-12-27 | Disposition: A | Discharge status: Home / Self Care | Payer: Medicare Other | Source: Ambulatory Visit | Attending: Oncology | Admitting: Oncology

## 2018-12-27 ENCOUNTER — Other Ambulatory Visit: Payer: Self-pay

## 2018-12-27 DIAGNOSIS — C7951 Secondary malignant neoplasm of bone: Secondary | ICD-10-CM | POA: Diagnosis not present

## 2018-12-27 DIAGNOSIS — C770 Secondary and unspecified malignant neoplasm of lymph nodes of head, face and neck: Secondary | ICD-10-CM | POA: Insufficient documentation

## 2018-12-27 DIAGNOSIS — R590 Localized enlarged lymph nodes: Secondary | ICD-10-CM | POA: Insufficient documentation

## 2018-12-27 DIAGNOSIS — Z853 Personal history of malignant neoplasm of breast: Secondary | ICD-10-CM | POA: Diagnosis not present

## 2018-12-27 MED ORDER — LIDOCAINE HCL (PF) 1 % IJ SOLN
INTRAMUSCULAR | Status: AC
  Filled 2018-12-27: qty 30

## 2018-12-27 MED ORDER — SODIUM CHLORIDE 0.9 % IV SOLN: INTRAVENOUS | Status: DC

## 2018-12-27 NOTE — Procedures (Signed)
Interventional Radiology Procedure:   Indications: Breast cancer and right supraclavicular lymph node/lesion  Procedure: US guided core biopsy of right supraclavicular lesion/node   Findings: Ill-defined lesion in right neck.  4 adequate cores obtained.  Complications: None     EBL: less than 10 ml  Plan: Discharge to home.     Alexandria Shiflett R. Anselm Pancoast, MD  Pager: [phone]

## 2018-12-27 NOTE — Discharge Instructions (Addendum)
Needle Biopsy, Care After °This sheet gives you information about how to care for yourself after your procedure. Your health care provider may also give you more specific instructions. If you have problems or questions, contact your health care provider. °What can I expect after the procedure? °After the procedure, it is common to have soreness, bruising, or mild pain at the puncture site. This should go away in a few days. °Follow these instructions at home: °Needle insertion site care ° °· Wash your hands with soap and water before you change your bandage (dressing). If you cannot use soap and water, use hand sanitizer. °· Follow instructions from your health care provider about how to take care of your puncture site. This includes: °? When and how to change your dressing. °? When to remove your dressing. °· Check your puncture site every day for signs of infection. Check for: °? Redness, swelling, or pain. °? Fluid or blood. °? Pus or a bad smell. °? Warmth. °General instructions °· Return to your normal activities as told by your health care provider. Ask your health care provider what activities are safe for you. °· Do not take baths, swim, or use a hot tub until your health care provider approves. Ask your health care provider if you may take showers. You may only be allowed to take sponge baths. °· Take over-the-counter and prescription medicines only as told by your health care provider. °· Keep all follow-up visits as told by your health care provider. This is important. °Contact a health care provider if: °· You have a fever. °· You have redness, swelling, or pain at the puncture site that lasts longer than a few days. °· You have fluid, blood, or pus coming from your puncture site. °· Your puncture site feels warm to the touch. °Get help right away if: °· You have severe bleeding from the puncture site. °Summary °· After the procedure, it is common to have soreness, bruising, or mild pain at the puncture  site. This should go away in a few days. °· Check your puncture site every day for signs of infection, such as redness, swelling, or pain. °· Get help right away if you have severe bleeding from your puncture site. °This information is not intended to replace advice given to you by your health care provider. Make sure you discuss any questions you have with your health care provider. °Document Released: 05/20/2014 Document Revised: 03/17/2017 Document Reviewed: 01/16/2017 °Elsevier Patient Education © 2020 Elsevier Inc. °Moderate Conscious Sedation, Adult, Care After °These instructions provide you with information about caring for yourself after your procedure. Your health care provider may also give you more specific instructions. Your treatment has been planned according to current medical practices, but problems sometimes occur. Call your health care provider if you have any problems or questions after your procedure. °What can I expect after the procedure? °After your procedure, it is common: °· To feel sleepy for several hours. °· To feel clumsy and have poor balance for several hours. °· To have poor judgment for several hours. °· To vomit if you eat too soon. °Follow these instructions at home: °For at least 24 hours after the procedure: ° °· Do not: °? Participate in activities where you could fall or become injured. °? Drive. °? Use heavy machinery. °? Drink alcohol. °? Take sleeping pills or medicines that cause drowsiness. °? Make important decisions or sign legal documents. °? Take care of children on your own. °· Rest. °Eating and   drinking °· Follow the diet recommended by your health care provider. °· If you vomit: °? Drink water, juice, or soup when you can drink without vomiting. °? Make sure you have little or no nausea before eating solid foods. °General instructions °· Have a responsible adult stay with you until you are awake and alert. °· Take over-the-counter and prescription medicines only as  told by your health care provider. °· If you smoke, do not smoke without supervision. °· Keep all follow-up visits as told by your health care provider. This is important. °Contact a health care provider if: °· You keep feeling nauseous or you keep vomiting. °· You feel light-headed. °· You develop a rash. °· You have a fever. °Get help right away if: °· You have trouble breathing. °This information is not intended to replace advice given to you by your health care provider. Make sure you discuss any questions you have with your health care provider. °Document Released: 10/24/2012 Document Revised: 12/16/2016 Document Reviewed: 04/25/2015 °Elsevier Patient Education © 2020 Elsevier Inc. ° °

## 2018-12-28 ENCOUNTER — Ambulatory Visit (HOSPITAL_COMMUNITY)
Admission: RE | Admit: 2018-12-28 | Discharge: 2018-12-28 | Disposition: A | Discharge status: Home / Self Care | Payer: Medicare Other | Source: Ambulatory Visit | Attending: Oncology | Admitting: Oncology

## 2018-12-28 DIAGNOSIS — C50811 Malignant neoplasm of overlapping sites of right female breast: Secondary | ICD-10-CM | POA: Insufficient documentation

## 2018-12-28 DIAGNOSIS — R590 Localized enlarged lymph nodes: Secondary | ICD-10-CM | POA: Insufficient documentation

## 2018-12-28 DIAGNOSIS — C7951 Secondary malignant neoplasm of bone: Secondary | ICD-10-CM | POA: Diagnosis present

## 2018-12-28 DIAGNOSIS — C50919 Malignant neoplasm of unspecified site of unspecified female breast: Secondary | ICD-10-CM | POA: Diagnosis not present

## 2018-12-28 DIAGNOSIS — K769 Liver disease, unspecified: Secondary | ICD-10-CM | POA: Insufficient documentation

## 2018-12-28 LAB — GLUCOSE, CAPILLARY: Glucose-Capillary: 119 mg/dL — ABNORMAL HIGH (ref 70–99)

## 2018-12-28 MED ORDER — FLUDEOXYGLUCOSE F - 18 (FDG) INJECTION
8.5000 | Freq: Once | INTRAVENOUS | Status: AC | PRN
  Administered 2018-12-28: 8.5 via INTRAVENOUS

## 2019-01-02 DIAGNOSIS — C50919 Malignant neoplasm of unspecified site of unspecified female breast: Secondary | ICD-10-CM | POA: Diagnosis not present

## 2019-01-02 DIAGNOSIS — R590 Localized enlarged lymph nodes: Secondary | ICD-10-CM | POA: Diagnosis not present

## 2019-01-02 DIAGNOSIS — C7951 Secondary malignant neoplasm of bone: Secondary | ICD-10-CM | POA: Diagnosis not present

## 2019-01-07 DIAGNOSIS — M47816 Spondylosis without myelopathy or radiculopathy, lumbar region: Secondary | ICD-10-CM | POA: Diagnosis not present

## 2019-01-07 DIAGNOSIS — M25552 Pain in left hip: Secondary | ICD-10-CM | POA: Diagnosis not present

## 2019-01-07 DIAGNOSIS — C7951 Secondary malignant neoplasm of bone: Secondary | ICD-10-CM | POA: Diagnosis not present

## 2019-01-07 DIAGNOSIS — Z79811 Long term (current) use of aromatase inhibitors: Secondary | ICD-10-CM | POA: Diagnosis not present

## 2019-01-07 DIAGNOSIS — M25551 Pain in right hip: Secondary | ICD-10-CM | POA: Diagnosis not present

## 2019-01-07 DIAGNOSIS — M545 Low back pain: Secondary | ICD-10-CM | POA: Diagnosis not present

## 2019-01-07 DIAGNOSIS — R590 Localized enlarged lymph nodes: Secondary | ICD-10-CM | POA: Diagnosis not present

## 2019-01-07 DIAGNOSIS — G893 Neoplasm related pain (acute) (chronic): Secondary | ICD-10-CM | POA: Diagnosis not present

## 2019-01-07 DIAGNOSIS — C50911 Malignant neoplasm of unspecified site of right female breast: Secondary | ICD-10-CM | POA: Diagnosis not present

## 2019-01-07 DIAGNOSIS — C50919 Malignant neoplasm of unspecified site of unspecified female breast: Secondary | ICD-10-CM | POA: Diagnosis not present

## 2019-01-16 DIAGNOSIS — C50919 Malignant neoplasm of unspecified site of unspecified female breast: Secondary | ICD-10-CM | POA: Diagnosis not present

## 2019-01-22 LAB — SURGICAL PATHOLOGY

## 2019-01-30 DIAGNOSIS — C50911 Malignant neoplasm of unspecified site of right female breast: Secondary | ICD-10-CM | POA: Diagnosis not present

## 2019-01-30 DIAGNOSIS — C50811 Malignant neoplasm of overlapping sites of right female breast: Secondary | ICD-10-CM | POA: Diagnosis not present

## 2019-01-30 DIAGNOSIS — G893 Neoplasm related pain (acute) (chronic): Secondary | ICD-10-CM | POA: Diagnosis not present

## 2019-01-30 DIAGNOSIS — Z79811 Long term (current) use of aromatase inhibitors: Secondary | ICD-10-CM | POA: Diagnosis not present

## 2019-01-30 DIAGNOSIS — Z87891 Personal history of nicotine dependence: Secondary | ICD-10-CM | POA: Diagnosis not present

## 2019-01-30 DIAGNOSIS — Z5112 Encounter for antineoplastic immunotherapy: Secondary | ICD-10-CM | POA: Diagnosis not present

## 2019-01-30 DIAGNOSIS — C7951 Secondary malignant neoplasm of bone: Secondary | ICD-10-CM | POA: Diagnosis not present

## 2019-01-30 DIAGNOSIS — E7800 Pure hypercholesterolemia, unspecified: Secondary | ICD-10-CM | POA: Diagnosis not present

## 2019-01-30 DIAGNOSIS — C50919 Malignant neoplasm of unspecified site of unspecified female breast: Secondary | ICD-10-CM | POA: Diagnosis not present

## 2019-02-22 DIAGNOSIS — R7301 Impaired fasting glucose: Secondary | ICD-10-CM | POA: Diagnosis not present

## 2019-02-22 DIAGNOSIS — E559 Vitamin D deficiency, unspecified: Secondary | ICD-10-CM | POA: Diagnosis not present

## 2019-02-22 DIAGNOSIS — C50919 Malignant neoplasm of unspecified site of unspecified female breast: Secondary | ICD-10-CM | POA: Diagnosis not present

## 2019-02-22 DIAGNOSIS — Z1389 Encounter for screening for other disorder: Secondary | ICD-10-CM | POA: Diagnosis not present

## 2019-02-22 DIAGNOSIS — C7951 Secondary malignant neoplasm of bone: Secondary | ICD-10-CM | POA: Diagnosis not present

## 2019-02-22 DIAGNOSIS — R4582 Worries: Secondary | ICD-10-CM | POA: Diagnosis not present

## 2019-02-26 DIAGNOSIS — C7951 Secondary malignant neoplasm of bone: Secondary | ICD-10-CM | POA: Diagnosis not present

## 2019-02-26 DIAGNOSIS — C50911 Malignant neoplasm of unspecified site of right female breast: Secondary | ICD-10-CM | POA: Diagnosis not present

## 2019-02-26 DIAGNOSIS — G893 Neoplasm related pain (acute) (chronic): Secondary | ICD-10-CM | POA: Diagnosis not present

## 2019-02-26 DIAGNOSIS — Z79811 Long term (current) use of aromatase inhibitors: Secondary | ICD-10-CM | POA: Diagnosis not present

## 2019-02-26 DIAGNOSIS — C50919 Malignant neoplasm of unspecified site of unspecified female breast: Secondary | ICD-10-CM | POA: Diagnosis not present

## 2019-02-26 DIAGNOSIS — Z09 Encounter for follow-up examination after completed treatment for conditions other than malignant neoplasm: Secondary | ICD-10-CM | POA: Diagnosis not present

## 2019-02-27 DIAGNOSIS — C50919 Malignant neoplasm of unspecified site of unspecified female breast: Secondary | ICD-10-CM | POA: Diagnosis not present

## 2019-02-27 DIAGNOSIS — C7951 Secondary malignant neoplasm of bone: Secondary | ICD-10-CM | POA: Diagnosis not present

## 2019-02-27 DIAGNOSIS — Z5112 Encounter for antineoplastic immunotherapy: Secondary | ICD-10-CM | POA: Diagnosis not present

## 2019-03-22 DIAGNOSIS — Z79811 Long term (current) use of aromatase inhibitors: Secondary | ICD-10-CM | POA: Diagnosis not present

## 2019-03-22 DIAGNOSIS — C7951 Secondary malignant neoplasm of bone: Secondary | ICD-10-CM | POA: Diagnosis not present

## 2019-03-22 DIAGNOSIS — C50911 Malignant neoplasm of unspecified site of right female breast: Secondary | ICD-10-CM | POA: Diagnosis not present

## 2019-03-22 DIAGNOSIS — Z09 Encounter for follow-up examination after completed treatment for conditions other than malignant neoplasm: Secondary | ICD-10-CM | POA: Diagnosis not present

## 2019-03-22 DIAGNOSIS — G893 Neoplasm related pain (acute) (chronic): Secondary | ICD-10-CM | POA: Diagnosis not present

## 2019-03-27 DIAGNOSIS — C7951 Secondary malignant neoplasm of bone: Secondary | ICD-10-CM | POA: Diagnosis not present

## 2019-03-27 DIAGNOSIS — C50919 Malignant neoplasm of unspecified site of unspecified female breast: Secondary | ICD-10-CM | POA: Diagnosis not present

## 2019-04-03 ENCOUNTER — Ambulatory Visit (HOSPITAL_COMMUNITY): Payer: Medicare Other

## 2019-04-09 ENCOUNTER — Encounter (HOSPITAL_COMMUNITY): Payer: Medicare Other

## 2019-04-09 ENCOUNTER — Inpatient Hospital Stay (HOSPITAL_COMMUNITY): Payer: Medicare Other | Attending: Hematology

## 2019-04-09 ENCOUNTER — Other Ambulatory Visit (HOSPITAL_COMMUNITY): Payer: Medicare Other

## 2019-04-18 ENCOUNTER — Ambulatory Visit (HOSPITAL_COMMUNITY): Payer: Medicare Other | Admitting: Nurse Practitioner

## 2019-04-23 DIAGNOSIS — Z09 Encounter for follow-up examination after completed treatment for conditions other than malignant neoplasm: Secondary | ICD-10-CM | POA: Diagnosis not present

## 2019-04-23 DIAGNOSIS — C50919 Malignant neoplasm of unspecified site of unspecified female breast: Secondary | ICD-10-CM | POA: Diagnosis not present

## 2019-04-23 DIAGNOSIS — C50911 Malignant neoplasm of unspecified site of right female breast: Secondary | ICD-10-CM | POA: Diagnosis not present

## 2019-04-23 DIAGNOSIS — C7951 Secondary malignant neoplasm of bone: Secondary | ICD-10-CM | POA: Diagnosis not present

## 2019-04-23 DIAGNOSIS — G893 Neoplasm related pain (acute) (chronic): Secondary | ICD-10-CM | POA: Diagnosis not present

## 2019-04-23 DIAGNOSIS — E507 Other ocular manifestations of vitamin A deficiency: Secondary | ICD-10-CM | POA: Diagnosis not present

## 2019-04-25 DIAGNOSIS — G893 Neoplasm related pain (acute) (chronic): Secondary | ICD-10-CM | POA: Diagnosis not present

## 2019-04-25 DIAGNOSIS — Z79811 Long term (current) use of aromatase inhibitors: Secondary | ICD-10-CM | POA: Diagnosis not present

## 2019-04-25 DIAGNOSIS — Z09 Encounter for follow-up examination after completed treatment for conditions other than malignant neoplasm: Secondary | ICD-10-CM | POA: Diagnosis not present

## 2019-04-25 DIAGNOSIS — C50911 Malignant neoplasm of unspecified site of right female breast: Secondary | ICD-10-CM | POA: Diagnosis not present

## 2019-04-25 DIAGNOSIS — C7951 Secondary malignant neoplasm of bone: Secondary | ICD-10-CM | POA: Diagnosis not present

## 2019-05-01 DIAGNOSIS — C50919 Malignant neoplasm of unspecified site of unspecified female breast: Secondary | ICD-10-CM | POA: Diagnosis not present

## 2019-05-01 DIAGNOSIS — C7951 Secondary malignant neoplasm of bone: Secondary | ICD-10-CM | POA: Diagnosis not present

## 2019-05-06 DIAGNOSIS — H04123 Dry eye syndrome of bilateral lacrimal glands: Secondary | ICD-10-CM | POA: Diagnosis not present

## 2019-05-06 DIAGNOSIS — D3131 Benign neoplasm of right choroid: Secondary | ICD-10-CM | POA: Diagnosis not present

## 2019-05-06 DIAGNOSIS — H25013 Cortical age-related cataract, bilateral: Secondary | ICD-10-CM | POA: Diagnosis not present

## 2019-05-06 DIAGNOSIS — H2513 Age-related nuclear cataract, bilateral: Secondary | ICD-10-CM | POA: Diagnosis not present

## 2019-05-14 ENCOUNTER — Other Ambulatory Visit (HOSPITAL_COMMUNITY): Payer: Self-pay | Admitting: Oncology

## 2019-05-14 ENCOUNTER — Other Ambulatory Visit: Payer: Self-pay | Admitting: Oncology

## 2019-05-14 DIAGNOSIS — Z09 Encounter for follow-up examination after completed treatment for conditions other than malignant neoplasm: Secondary | ICD-10-CM | POA: Diagnosis not present

## 2019-05-14 DIAGNOSIS — C50919 Malignant neoplasm of unspecified site of unspecified female breast: Secondary | ICD-10-CM

## 2019-05-14 DIAGNOSIS — C7951 Secondary malignant neoplasm of bone: Secondary | ICD-10-CM | POA: Diagnosis not present

## 2019-05-14 DIAGNOSIS — Z79811 Long term (current) use of aromatase inhibitors: Secondary | ICD-10-CM | POA: Diagnosis not present

## 2019-05-14 DIAGNOSIS — G893 Neoplasm related pain (acute) (chronic): Secondary | ICD-10-CM | POA: Diagnosis not present

## 2019-05-22 ENCOUNTER — Ambulatory Visit (HOSPITAL_COMMUNITY)
Admission: RE | Admit: 2019-05-22 | Discharge: 2019-05-22 | Disposition: A | Discharge status: Home / Self Care | Payer: Medicare Other | Source: Ambulatory Visit | Attending: Oncology | Admitting: Oncology

## 2019-05-22 ENCOUNTER — Other Ambulatory Visit: Payer: Self-pay

## 2019-05-22 DIAGNOSIS — C7951 Secondary malignant neoplasm of bone: Secondary | ICD-10-CM | POA: Diagnosis not present

## 2019-05-22 DIAGNOSIS — C50919 Malignant neoplasm of unspecified site of unspecified female breast: Secondary | ICD-10-CM | POA: Insufficient documentation

## 2019-05-22 DIAGNOSIS — C787 Secondary malignant neoplasm of liver and intrahepatic bile duct: Secondary | ICD-10-CM | POA: Insufficient documentation

## 2019-05-22 DIAGNOSIS — I700 Atherosclerosis of aorta: Secondary | ICD-10-CM | POA: Diagnosis not present

## 2019-05-22 DIAGNOSIS — C50911 Malignant neoplasm of unspecified site of right female breast: Secondary | ICD-10-CM | POA: Diagnosis not present

## 2019-05-22 MED ORDER — FLUDEOXYGLUCOSE F - 18 (FDG) INJECTION
9.4200 | Freq: Once | INTRAVENOUS | Status: AC
  Administered 2019-05-22: 12:00:00 9.42 via INTRAVENOUS

## 2019-05-23 LAB — GLUCOSE, CAPILLARY: Glucose-Capillary: 113 mg/dL — ABNORMAL HIGH (ref 70–99)

## 2019-05-27 DIAGNOSIS — C50911 Malignant neoplasm of unspecified site of right female breast: Secondary | ICD-10-CM | POA: Diagnosis not present

## 2019-05-27 DIAGNOSIS — Z79811 Long term (current) use of aromatase inhibitors: Secondary | ICD-10-CM | POA: Diagnosis not present

## 2019-05-27 DIAGNOSIS — C787 Secondary malignant neoplasm of liver and intrahepatic bile duct: Secondary | ICD-10-CM | POA: Diagnosis not present

## 2019-05-27 DIAGNOSIS — C7951 Secondary malignant neoplasm of bone: Secondary | ICD-10-CM | POA: Diagnosis not present

## 2019-05-27 DIAGNOSIS — C50919 Malignant neoplasm of unspecified site of unspecified female breast: Secondary | ICD-10-CM | POA: Diagnosis not present

## 2019-05-27 DIAGNOSIS — Z09 Encounter for follow-up examination after completed treatment for conditions other than malignant neoplasm: Secondary | ICD-10-CM | POA: Diagnosis not present

## 2019-06-05 DIAGNOSIS — C7951 Secondary malignant neoplasm of bone: Secondary | ICD-10-CM | POA: Diagnosis not present

## 2019-06-05 DIAGNOSIS — Z5112 Encounter for antineoplastic immunotherapy: Secondary | ICD-10-CM | POA: Diagnosis not present

## 2019-06-05 DIAGNOSIS — C50919 Malignant neoplasm of unspecified site of unspecified female breast: Secondary | ICD-10-CM | POA: Diagnosis not present

## 2019-06-12 DIAGNOSIS — C50911 Malignant neoplasm of unspecified site of right female breast: Secondary | ICD-10-CM | POA: Diagnosis not present

## 2019-06-12 DIAGNOSIS — C787 Secondary malignant neoplasm of liver and intrahepatic bile duct: Secondary | ICD-10-CM | POA: Diagnosis not present

## 2019-06-12 DIAGNOSIS — Z79811 Long term (current) use of aromatase inhibitors: Secondary | ICD-10-CM | POA: Diagnosis not present

## 2019-06-12 DIAGNOSIS — C7951 Secondary malignant neoplasm of bone: Secondary | ICD-10-CM | POA: Diagnosis not present

## 2019-06-12 DIAGNOSIS — G893 Neoplasm related pain (acute) (chronic): Secondary | ICD-10-CM | POA: Diagnosis not present

## 2019-06-19 DIAGNOSIS — C787 Secondary malignant neoplasm of liver and intrahepatic bile duct: Secondary | ICD-10-CM | POA: Diagnosis not present

## 2019-06-19 DIAGNOSIS — Z79899 Other long term (current) drug therapy: Secondary | ICD-10-CM | POA: Diagnosis not present

## 2019-06-19 DIAGNOSIS — C50919 Malignant neoplasm of unspecified site of unspecified female breast: Secondary | ICD-10-CM | POA: Diagnosis not present

## 2019-06-19 DIAGNOSIS — Z09 Encounter for follow-up examination after completed treatment for conditions other than malignant neoplasm: Secondary | ICD-10-CM | POA: Diagnosis not present

## 2019-06-19 DIAGNOSIS — Z9011 Acquired absence of right breast and nipple: Secondary | ICD-10-CM | POA: Diagnosis not present

## 2019-06-19 DIAGNOSIS — E079 Disorder of thyroid, unspecified: Secondary | ICD-10-CM | POA: Diagnosis not present

## 2019-06-19 DIAGNOSIS — C7951 Secondary malignant neoplasm of bone: Secondary | ICD-10-CM | POA: Diagnosis not present

## 2019-06-19 DIAGNOSIS — Z87891 Personal history of nicotine dependence: Secondary | ICD-10-CM | POA: Diagnosis not present

## 2019-06-19 DIAGNOSIS — R590 Localized enlarged lymph nodes: Secondary | ICD-10-CM | POA: Diagnosis not present

## 2019-06-19 DIAGNOSIS — C50911 Malignant neoplasm of unspecified site of right female breast: Secondary | ICD-10-CM | POA: Diagnosis not present

## 2019-06-19 DIAGNOSIS — E7800 Pure hypercholesterolemia, unspecified: Secondary | ICD-10-CM | POA: Diagnosis not present

## 2019-06-19 DIAGNOSIS — Z9289 Personal history of other medical treatment: Secondary | ICD-10-CM | POA: Diagnosis not present

## 2019-06-28 DIAGNOSIS — C7951 Secondary malignant neoplasm of bone: Secondary | ICD-10-CM | POA: Diagnosis not present

## 2019-06-28 DIAGNOSIS — C787 Secondary malignant neoplasm of liver and intrahepatic bile duct: Secondary | ICD-10-CM | POA: Diagnosis not present

## 2019-06-28 DIAGNOSIS — Z515 Encounter for palliative care: Secondary | ICD-10-CM | POA: Diagnosis not present

## 2019-06-28 DIAGNOSIS — C50911 Malignant neoplasm of unspecified site of right female breast: Secondary | ICD-10-CM | POA: Diagnosis not present

## 2019-07-01 DIAGNOSIS — C50911 Malignant neoplasm of unspecified site of right female breast: Secondary | ICD-10-CM | POA: Diagnosis not present

## 2019-07-01 DIAGNOSIS — C787 Secondary malignant neoplasm of liver and intrahepatic bile duct: Secondary | ICD-10-CM | POA: Diagnosis not present

## 2019-07-01 DIAGNOSIS — Z09 Encounter for follow-up examination after completed treatment for conditions other than malignant neoplasm: Secondary | ICD-10-CM | POA: Diagnosis not present

## 2019-07-01 DIAGNOSIS — G893 Neoplasm related pain (acute) (chronic): Secondary | ICD-10-CM | POA: Diagnosis not present

## 2019-07-01 DIAGNOSIS — Z9289 Personal history of other medical treatment: Secondary | ICD-10-CM | POA: Diagnosis not present

## 2019-07-01 DIAGNOSIS — C7951 Secondary malignant neoplasm of bone: Secondary | ICD-10-CM | POA: Diagnosis not present

## 2019-07-01 DIAGNOSIS — C50919 Malignant neoplasm of unspecified site of unspecified female breast: Secondary | ICD-10-CM | POA: Diagnosis not present

## 2019-07-03 DIAGNOSIS — C50919 Malignant neoplasm of unspecified site of unspecified female breast: Secondary | ICD-10-CM | POA: Diagnosis not present

## 2019-07-03 DIAGNOSIS — C50911 Malignant neoplasm of unspecified site of right female breast: Secondary | ICD-10-CM | POA: Diagnosis not present

## 2019-07-03 DIAGNOSIS — E559 Vitamin D deficiency, unspecified: Secondary | ICD-10-CM | POA: Diagnosis not present

## 2019-07-03 DIAGNOSIS — Z0001 Encounter for general adult medical examination with abnormal findings: Secondary | ICD-10-CM | POA: Diagnosis not present

## 2019-07-03 DIAGNOSIS — E782 Mixed hyperlipidemia: Secondary | ICD-10-CM | POA: Diagnosis not present

## 2019-07-03 DIAGNOSIS — C7951 Secondary malignant neoplasm of bone: Secondary | ICD-10-CM | POA: Diagnosis not present

## 2019-07-16 DIAGNOSIS — R300 Dysuria: Secondary | ICD-10-CM | POA: Diagnosis not present

## 2019-07-16 DIAGNOSIS — R350 Frequency of micturition: Secondary | ICD-10-CM | POA: Diagnosis not present

## 2019-07-23 DIAGNOSIS — R300 Dysuria: Secondary | ICD-10-CM | POA: Diagnosis not present

## 2019-07-25 DIAGNOSIS — C50211 Malignant neoplasm of upper-inner quadrant of right female breast: Secondary | ICD-10-CM | POA: Diagnosis not present

## 2019-07-25 DIAGNOSIS — C787 Secondary malignant neoplasm of liver and intrahepatic bile duct: Secondary | ICD-10-CM | POA: Diagnosis not present

## 2019-07-25 DIAGNOSIS — C7951 Secondary malignant neoplasm of bone: Secondary | ICD-10-CM | POA: Diagnosis not present

## 2019-07-25 DIAGNOSIS — Z515 Encounter for palliative care: Secondary | ICD-10-CM | POA: Diagnosis not present

## 2019-07-29 DIAGNOSIS — Z09 Encounter for follow-up examination after completed treatment for conditions other than malignant neoplasm: Secondary | ICD-10-CM | POA: Diagnosis not present

## 2019-07-29 DIAGNOSIS — Z79811 Long term (current) use of aromatase inhibitors: Secondary | ICD-10-CM | POA: Diagnosis not present

## 2019-07-29 DIAGNOSIS — C50919 Malignant neoplasm of unspecified site of unspecified female breast: Secondary | ICD-10-CM | POA: Diagnosis not present

## 2019-07-29 DIAGNOSIS — C787 Secondary malignant neoplasm of liver and intrahepatic bile duct: Secondary | ICD-10-CM | POA: Diagnosis not present

## 2019-07-29 DIAGNOSIS — C7951 Secondary malignant neoplasm of bone: Secondary | ICD-10-CM | POA: Diagnosis not present

## 2019-07-31 DIAGNOSIS — Z79899 Other long term (current) drug therapy: Secondary | ICD-10-CM | POA: Diagnosis not present

## 2019-07-31 DIAGNOSIS — C7951 Secondary malignant neoplasm of bone: Secondary | ICD-10-CM | POA: Diagnosis not present

## 2019-07-31 DIAGNOSIS — C50911 Malignant neoplasm of unspecified site of right female breast: Secondary | ICD-10-CM | POA: Diagnosis not present

## 2019-08-01 DIAGNOSIS — C7951 Secondary malignant neoplasm of bone: Secondary | ICD-10-CM | POA: Diagnosis not present

## 2019-08-01 DIAGNOSIS — C50919 Malignant neoplasm of unspecified site of unspecified female breast: Secondary | ICD-10-CM | POA: Diagnosis not present

## 2019-08-22 DIAGNOSIS — C7951 Secondary malignant neoplasm of bone: Secondary | ICD-10-CM | POA: Diagnosis not present

## 2019-08-22 DIAGNOSIS — C50911 Malignant neoplasm of unspecified site of right female breast: Secondary | ICD-10-CM | POA: Diagnosis not present

## 2019-08-22 DIAGNOSIS — Z515 Encounter for palliative care: Secondary | ICD-10-CM | POA: Diagnosis not present

## 2019-08-22 DIAGNOSIS — C787 Secondary malignant neoplasm of liver and intrahepatic bile duct: Secondary | ICD-10-CM | POA: Diagnosis not present

## 2019-08-27 DIAGNOSIS — C50919 Malignant neoplasm of unspecified site of unspecified female breast: Secondary | ICD-10-CM | POA: Diagnosis not present

## 2019-08-27 DIAGNOSIS — C787 Secondary malignant neoplasm of liver and intrahepatic bile duct: Secondary | ICD-10-CM | POA: Diagnosis not present

## 2019-08-27 DIAGNOSIS — Z09 Encounter for follow-up examination after completed treatment for conditions other than malignant neoplasm: Secondary | ICD-10-CM | POA: Diagnosis not present

## 2019-08-27 DIAGNOSIS — C50911 Malignant neoplasm of unspecified site of right female breast: Secondary | ICD-10-CM | POA: Diagnosis not present

## 2019-08-27 DIAGNOSIS — R300 Dysuria: Secondary | ICD-10-CM | POA: Diagnosis not present

## 2019-08-27 DIAGNOSIS — C7951 Secondary malignant neoplasm of bone: Secondary | ICD-10-CM | POA: Diagnosis not present

## 2019-09-02 DIAGNOSIS — E782 Mixed hyperlipidemia: Secondary | ICD-10-CM | POA: Diagnosis not present

## 2019-09-02 DIAGNOSIS — R7301 Impaired fasting glucose: Secondary | ICD-10-CM | POA: Diagnosis not present

## 2019-09-02 DIAGNOSIS — C7951 Secondary malignant neoplasm of bone: Secondary | ICD-10-CM | POA: Diagnosis not present

## 2019-09-02 DIAGNOSIS — R748 Abnormal levels of other serum enzymes: Secondary | ICD-10-CM | POA: Diagnosis not present

## 2019-09-02 DIAGNOSIS — Z9189 Other specified personal risk factors, not elsewhere classified: Secondary | ICD-10-CM | POA: Diagnosis not present

## 2019-09-02 DIAGNOSIS — G4733 Obstructive sleep apnea (adult) (pediatric): Secondary | ICD-10-CM | POA: Diagnosis not present

## 2019-09-04 DIAGNOSIS — C50919 Malignant neoplasm of unspecified site of unspecified female breast: Secondary | ICD-10-CM | POA: Diagnosis not present

## 2019-09-04 DIAGNOSIS — C50911 Malignant neoplasm of unspecified site of right female breast: Secondary | ICD-10-CM | POA: Diagnosis not present

## 2019-09-04 DIAGNOSIS — Z7189 Other specified counseling: Secondary | ICD-10-CM | POA: Diagnosis not present

## 2019-09-04 DIAGNOSIS — Z5112 Encounter for antineoplastic immunotherapy: Secondary | ICD-10-CM | POA: Diagnosis not present

## 2019-09-04 DIAGNOSIS — C787 Secondary malignant neoplasm of liver and intrahepatic bile duct: Secondary | ICD-10-CM | POA: Diagnosis not present

## 2019-09-04 DIAGNOSIS — Z79811 Long term (current) use of aromatase inhibitors: Secondary | ICD-10-CM | POA: Diagnosis not present

## 2019-09-04 DIAGNOSIS — C7951 Secondary malignant neoplasm of bone: Secondary | ICD-10-CM | POA: Diagnosis not present

## 2019-09-13 DIAGNOSIS — C787 Secondary malignant neoplasm of liver and intrahepatic bile duct: Secondary | ICD-10-CM | POA: Diagnosis not present

## 2019-09-13 DIAGNOSIS — C50919 Malignant neoplasm of unspecified site of unspecified female breast: Secondary | ICD-10-CM | POA: Diagnosis not present

## 2019-09-13 DIAGNOSIS — Z7189 Other specified counseling: Secondary | ICD-10-CM | POA: Diagnosis not present

## 2019-09-13 DIAGNOSIS — Z79811 Long term (current) use of aromatase inhibitors: Secondary | ICD-10-CM | POA: Diagnosis not present

## 2019-09-13 DIAGNOSIS — C7951 Secondary malignant neoplasm of bone: Secondary | ICD-10-CM | POA: Diagnosis not present

## 2019-09-19 DIAGNOSIS — Z515 Encounter for palliative care: Secondary | ICD-10-CM | POA: Diagnosis not present

## 2019-09-19 DIAGNOSIS — C7951 Secondary malignant neoplasm of bone: Secondary | ICD-10-CM | POA: Diagnosis not present

## 2019-09-19 DIAGNOSIS — C50911 Malignant neoplasm of unspecified site of right female breast: Secondary | ICD-10-CM | POA: Diagnosis not present

## 2019-09-30 DIAGNOSIS — T451X5A Adverse effect of antineoplastic and immunosuppressive drugs, initial encounter: Secondary | ICD-10-CM | POA: Diagnosis not present

## 2019-09-30 DIAGNOSIS — D701 Agranulocytosis secondary to cancer chemotherapy: Secondary | ICD-10-CM | POA: Diagnosis not present

## 2019-09-30 DIAGNOSIS — C787 Secondary malignant neoplasm of liver and intrahepatic bile duct: Secondary | ICD-10-CM | POA: Diagnosis not present

## 2019-09-30 DIAGNOSIS — C7951 Secondary malignant neoplasm of bone: Secondary | ICD-10-CM | POA: Diagnosis not present

## 2019-09-30 DIAGNOSIS — C50919 Malignant neoplasm of unspecified site of unspecified female breast: Secondary | ICD-10-CM | POA: Diagnosis not present

## 2019-10-02 DIAGNOSIS — C787 Secondary malignant neoplasm of liver and intrahepatic bile duct: Secondary | ICD-10-CM | POA: Diagnosis not present

## 2019-10-02 DIAGNOSIS — G893 Neoplasm related pain (acute) (chronic): Secondary | ICD-10-CM | POA: Diagnosis not present

## 2019-10-02 DIAGNOSIS — H04123 Dry eye syndrome of bilateral lacrimal glands: Secondary | ICD-10-CM | POA: Diagnosis not present

## 2019-10-02 DIAGNOSIS — C50919 Malignant neoplasm of unspecified site of unspecified female breast: Secondary | ICD-10-CM | POA: Diagnosis not present

## 2019-10-02 DIAGNOSIS — E039 Hypothyroidism, unspecified: Secondary | ICD-10-CM | POA: Diagnosis not present

## 2019-10-02 DIAGNOSIS — R911 Solitary pulmonary nodule: Secondary | ICD-10-CM | POA: Diagnosis not present

## 2019-10-02 DIAGNOSIS — C7951 Secondary malignant neoplasm of bone: Secondary | ICD-10-CM | POA: Diagnosis not present

## 2019-10-02 DIAGNOSIS — Z170 Estrogen receptor positive status [ER+]: Secondary | ICD-10-CM | POA: Diagnosis not present

## 2019-10-02 DIAGNOSIS — I7090 Unspecified atherosclerosis: Secondary | ICD-10-CM | POA: Diagnosis not present

## 2019-10-02 DIAGNOSIS — M791 Myalgia, unspecified site: Secondary | ICD-10-CM | POA: Diagnosis not present

## 2019-10-02 DIAGNOSIS — C50911 Malignant neoplasm of unspecified site of right female breast: Secondary | ICD-10-CM | POA: Diagnosis not present

## 2019-10-02 DIAGNOSIS — R300 Dysuria: Secondary | ICD-10-CM | POA: Diagnosis not present

## 2019-10-02 DIAGNOSIS — M8580 Other specified disorders of bone density and structure, unspecified site: Secondary | ICD-10-CM | POA: Diagnosis not present

## 2019-10-02 DIAGNOSIS — R591 Generalized enlarged lymph nodes: Secondary | ICD-10-CM | POA: Diagnosis not present

## 2019-10-02 DIAGNOSIS — E079 Disorder of thyroid, unspecified: Secondary | ICD-10-CM | POA: Diagnosis not present

## 2019-10-02 DIAGNOSIS — G2581 Restless legs syndrome: Secondary | ICD-10-CM | POA: Diagnosis not present

## 2019-10-02 DIAGNOSIS — Z801 Family history of malignant neoplasm of trachea, bronchus and lung: Secondary | ICD-10-CM | POA: Diagnosis not present

## 2019-10-02 DIAGNOSIS — L299 Pruritus, unspecified: Secondary | ICD-10-CM | POA: Diagnosis not present

## 2019-10-02 DIAGNOSIS — R112 Nausea with vomiting, unspecified: Secondary | ICD-10-CM | POA: Diagnosis not present

## 2019-10-02 DIAGNOSIS — M545 Low back pain: Secondary | ICD-10-CM | POA: Diagnosis not present

## 2019-10-02 DIAGNOSIS — Z515 Encounter for palliative care: Secondary | ICD-10-CM | POA: Diagnosis not present

## 2019-10-02 DIAGNOSIS — E7800 Pure hypercholesterolemia, unspecified: Secondary | ICD-10-CM | POA: Diagnosis not present

## 2019-10-02 DIAGNOSIS — Z5112 Encounter for antineoplastic immunotherapy: Secondary | ICD-10-CM | POA: Diagnosis not present

## 2019-10-02 DIAGNOSIS — E785 Hyperlipidemia, unspecified: Secondary | ICD-10-CM | POA: Diagnosis not present

## 2019-10-29 DIAGNOSIS — C50919 Malignant neoplasm of unspecified site of unspecified female breast: Secondary | ICD-10-CM | POA: Diagnosis not present

## 2019-10-29 DIAGNOSIS — C50011 Malignant neoplasm of nipple and areola, right female breast: Secondary | ICD-10-CM | POA: Diagnosis not present

## 2019-10-29 DIAGNOSIS — C7951 Secondary malignant neoplasm of bone: Secondary | ICD-10-CM | POA: Diagnosis not present

## 2019-10-29 DIAGNOSIS — G893 Neoplasm related pain (acute) (chronic): Secondary | ICD-10-CM | POA: Diagnosis not present

## 2019-10-29 DIAGNOSIS — Z09 Encounter for follow-up examination after completed treatment for conditions other than malignant neoplasm: Secondary | ICD-10-CM | POA: Diagnosis not present

## 2019-10-30 DIAGNOSIS — C7951 Secondary malignant neoplasm of bone: Secondary | ICD-10-CM | POA: Diagnosis not present

## 2019-10-30 DIAGNOSIS — C50911 Malignant neoplasm of unspecified site of right female breast: Secondary | ICD-10-CM | POA: Diagnosis not present

## 2019-10-30 DIAGNOSIS — Z170 Estrogen receptor positive status [ER+]: Secondary | ICD-10-CM | POA: Diagnosis not present

## 2019-11-04 DIAGNOSIS — C50811 Malignant neoplasm of overlapping sites of right female breast: Secondary | ICD-10-CM | POA: Diagnosis not present

## 2019-11-04 DIAGNOSIS — Z09 Encounter for follow-up examination after completed treatment for conditions other than malignant neoplasm: Secondary | ICD-10-CM | POA: Diagnosis not present

## 2019-11-06 DIAGNOSIS — C7951 Secondary malignant neoplasm of bone: Secondary | ICD-10-CM | POA: Diagnosis not present

## 2019-11-06 DIAGNOSIS — C50919 Malignant neoplasm of unspecified site of unspecified female breast: Secondary | ICD-10-CM | POA: Diagnosis not present

## 2019-11-15 DIAGNOSIS — Z515 Encounter for palliative care: Secondary | ICD-10-CM | POA: Diagnosis not present

## 2019-11-15 DIAGNOSIS — C7951 Secondary malignant neoplasm of bone: Secondary | ICD-10-CM | POA: Diagnosis not present

## 2019-11-15 DIAGNOSIS — C50911 Malignant neoplasm of unspecified site of right female breast: Secondary | ICD-10-CM | POA: Diagnosis not present

## 2019-11-15 DIAGNOSIS — C787 Secondary malignant neoplasm of liver and intrahepatic bile duct: Secondary | ICD-10-CM | POA: Diagnosis not present

## 2019-11-16 DIAGNOSIS — E7849 Other hyperlipidemia: Secondary | ICD-10-CM | POA: Diagnosis not present

## 2019-11-16 DIAGNOSIS — G4733 Obstructive sleep apnea (adult) (pediatric): Secondary | ICD-10-CM | POA: Diagnosis not present

## 2019-11-16 DIAGNOSIS — C50919 Malignant neoplasm of unspecified site of unspecified female breast: Secondary | ICD-10-CM | POA: Diagnosis not present

## 2019-11-19 DIAGNOSIS — C50919 Malignant neoplasm of unspecified site of unspecified female breast: Secondary | ICD-10-CM | POA: Diagnosis not present

## 2019-11-19 DIAGNOSIS — Z09 Encounter for follow-up examination after completed treatment for conditions other than malignant neoplasm: Secondary | ICD-10-CM | POA: Diagnosis not present

## 2019-11-19 DIAGNOSIS — Z7189 Other specified counseling: Secondary | ICD-10-CM | POA: Diagnosis not present

## 2019-11-19 DIAGNOSIS — G893 Neoplasm related pain (acute) (chronic): Secondary | ICD-10-CM | POA: Diagnosis not present

## 2019-11-19 DIAGNOSIS — C50811 Malignant neoplasm of overlapping sites of right female breast: Secondary | ICD-10-CM | POA: Diagnosis not present

## 2019-11-19 DIAGNOSIS — C7951 Secondary malignant neoplasm of bone: Secondary | ICD-10-CM | POA: Diagnosis not present

## 2019-12-04 DIAGNOSIS — C50919 Malignant neoplasm of unspecified site of unspecified female breast: Secondary | ICD-10-CM | POA: Diagnosis not present

## 2019-12-04 DIAGNOSIS — C7951 Secondary malignant neoplasm of bone: Secondary | ICD-10-CM | POA: Diagnosis not present

## 2019-12-17 DIAGNOSIS — E7849 Other hyperlipidemia: Secondary | ICD-10-CM | POA: Diagnosis not present

## 2019-12-17 DIAGNOSIS — C50919 Malignant neoplasm of unspecified site of unspecified female breast: Secondary | ICD-10-CM | POA: Diagnosis not present

## 2019-12-17 DIAGNOSIS — G4733 Obstructive sleep apnea (adult) (pediatric): Secondary | ICD-10-CM | POA: Diagnosis not present

## 2020-01-13 DIAGNOSIS — C50811 Malignant neoplasm of overlapping sites of right female breast: Secondary | ICD-10-CM | POA: Diagnosis not present

## 2020-01-13 DIAGNOSIS — G893 Neoplasm related pain (acute) (chronic): Secondary | ICD-10-CM | POA: Diagnosis not present

## 2020-01-13 DIAGNOSIS — T451X5A Adverse effect of antineoplastic and immunosuppressive drugs, initial encounter: Secondary | ICD-10-CM | POA: Diagnosis not present

## 2020-01-13 DIAGNOSIS — Z79811 Long term (current) use of aromatase inhibitors: Secondary | ICD-10-CM | POA: Diagnosis not present

## 2020-01-13 DIAGNOSIS — Z09 Encounter for follow-up examination after completed treatment for conditions other than malignant neoplasm: Secondary | ICD-10-CM | POA: Diagnosis not present

## 2020-01-13 DIAGNOSIS — C50919 Malignant neoplasm of unspecified site of unspecified female breast: Secondary | ICD-10-CM | POA: Diagnosis not present

## 2020-01-13 DIAGNOSIS — C7951 Secondary malignant neoplasm of bone: Secondary | ICD-10-CM | POA: Diagnosis not present

## 2020-01-13 DIAGNOSIS — D701 Agranulocytosis secondary to cancer chemotherapy: Secondary | ICD-10-CM | POA: Diagnosis not present

## 2020-01-13 DIAGNOSIS — C787 Secondary malignant neoplasm of liver and intrahepatic bile duct: Secondary | ICD-10-CM | POA: Diagnosis not present

## 2020-01-17 DIAGNOSIS — E7849 Other hyperlipidemia: Secondary | ICD-10-CM | POA: Diagnosis not present

## 2020-01-17 DIAGNOSIS — G4733 Obstructive sleep apnea (adult) (pediatric): Secondary | ICD-10-CM | POA: Diagnosis not present

## 2020-01-17 DIAGNOSIS — C50919 Malignant neoplasm of unspecified site of unspecified female breast: Secondary | ICD-10-CM | POA: Diagnosis not present

## 2020-01-20 DIAGNOSIS — C7989 Secondary malignant neoplasm of other specified sites: Secondary | ICD-10-CM | POA: Diagnosis not present

## 2020-01-20 DIAGNOSIS — K429 Umbilical hernia without obstruction or gangrene: Secondary | ICD-10-CM | POA: Diagnosis not present

## 2020-01-20 DIAGNOSIS — Z8505 Personal history of malignant neoplasm of liver: Secondary | ICD-10-CM | POA: Diagnosis not present

## 2020-01-20 DIAGNOSIS — C787 Secondary malignant neoplasm of liver and intrahepatic bile duct: Secondary | ICD-10-CM | POA: Diagnosis not present

## 2020-01-20 DIAGNOSIS — R918 Other nonspecific abnormal finding of lung field: Secondary | ICD-10-CM | POA: Diagnosis not present

## 2020-01-20 DIAGNOSIS — C7951 Secondary malignant neoplasm of bone: Secondary | ICD-10-CM | POA: Diagnosis not present

## 2020-01-20 DIAGNOSIS — C50919 Malignant neoplasm of unspecified site of unspecified female breast: Secondary | ICD-10-CM | POA: Diagnosis not present

## 2020-01-22 DIAGNOSIS — Z09 Encounter for follow-up examination after completed treatment for conditions other than malignant neoplasm: Secondary | ICD-10-CM | POA: Diagnosis not present

## 2020-01-22 DIAGNOSIS — C7951 Secondary malignant neoplasm of bone: Secondary | ICD-10-CM | POA: Diagnosis not present

## 2020-01-22 DIAGNOSIS — G893 Neoplasm related pain (acute) (chronic): Secondary | ICD-10-CM | POA: Diagnosis not present

## 2020-01-22 DIAGNOSIS — Z9289 Personal history of other medical treatment: Secondary | ICD-10-CM | POA: Diagnosis not present

## 2020-01-22 DIAGNOSIS — C787 Secondary malignant neoplasm of liver and intrahepatic bile duct: Secondary | ICD-10-CM | POA: Diagnosis not present

## 2020-01-22 DIAGNOSIS — Z79811 Long term (current) use of aromatase inhibitors: Secondary | ICD-10-CM | POA: Diagnosis not present

## 2020-01-22 DIAGNOSIS — Z7189 Other specified counseling: Secondary | ICD-10-CM | POA: Diagnosis not present

## 2020-01-22 DIAGNOSIS — C50919 Malignant neoplasm of unspecified site of unspecified female breast: Secondary | ICD-10-CM | POA: Diagnosis not present

## 2020-01-23 DIAGNOSIS — Z79899 Other long term (current) drug therapy: Secondary | ICD-10-CM | POA: Diagnosis not present

## 2020-01-23 DIAGNOSIS — R1110 Vomiting, unspecified: Secondary | ICD-10-CM | POA: Diagnosis not present

## 2020-01-23 DIAGNOSIS — R112 Nausea with vomiting, unspecified: Secondary | ICD-10-CM | POA: Diagnosis not present

## 2020-01-23 DIAGNOSIS — Z885 Allergy status to narcotic agent status: Secondary | ICD-10-CM | POA: Diagnosis not present

## 2020-01-23 DIAGNOSIS — F32A Depression, unspecified: Secondary | ICD-10-CM | POA: Diagnosis not present

## 2020-01-23 DIAGNOSIS — I700 Atherosclerosis of aorta: Secondary | ICD-10-CM | POA: Diagnosis not present

## 2020-01-23 DIAGNOSIS — C787 Secondary malignant neoplasm of liver and intrahepatic bile duct: Secondary | ICD-10-CM | POA: Diagnosis not present

## 2020-01-23 DIAGNOSIS — E079 Disorder of thyroid, unspecified: Secondary | ICD-10-CM | POA: Diagnosis not present

## 2020-01-23 DIAGNOSIS — R9431 Abnormal electrocardiogram [ECG] [EKG]: Secondary | ICD-10-CM | POA: Diagnosis not present

## 2020-01-23 DIAGNOSIS — Z888 Allergy status to other drugs, medicaments and biological substances status: Secondary | ICD-10-CM | POA: Diagnosis not present

## 2020-01-23 DIAGNOSIS — U071 COVID-19: Secondary | ICD-10-CM | POA: Diagnosis not present

## 2020-01-23 DIAGNOSIS — E872 Acidosis: Secondary | ICD-10-CM | POA: Diagnosis not present

## 2020-01-23 DIAGNOSIS — Z7984 Long term (current) use of oral hypoglycemic drugs: Secondary | ICD-10-CM | POA: Diagnosis not present

## 2020-01-23 DIAGNOSIS — A419 Sepsis, unspecified organism: Secondary | ICD-10-CM | POA: Diagnosis not present

## 2020-01-23 DIAGNOSIS — J9811 Atelectasis: Secondary | ICD-10-CM | POA: Diagnosis not present

## 2020-01-23 DIAGNOSIS — R109 Unspecified abdominal pain: Secondary | ICD-10-CM | POA: Diagnosis not present

## 2020-01-27 DIAGNOSIS — R7301 Impaired fasting glucose: Secondary | ICD-10-CM | POA: Diagnosis not present

## 2020-01-27 DIAGNOSIS — E7849 Other hyperlipidemia: Secondary | ICD-10-CM | POA: Diagnosis not present

## 2020-01-27 DIAGNOSIS — E559 Vitamin D deficiency, unspecified: Secondary | ICD-10-CM | POA: Diagnosis not present

## 2020-01-27 DIAGNOSIS — C7951 Secondary malignant neoplasm of bone: Secondary | ICD-10-CM | POA: Diagnosis not present

## 2020-01-27 DIAGNOSIS — C50911 Malignant neoplasm of unspecified site of right female breast: Secondary | ICD-10-CM | POA: Diagnosis not present

## 2020-01-27 DIAGNOSIS — C50919 Malignant neoplasm of unspecified site of unspecified female breast: Secondary | ICD-10-CM | POA: Diagnosis not present

## 2020-01-27 DIAGNOSIS — G4733 Obstructive sleep apnea (adult) (pediatric): Secondary | ICD-10-CM | POA: Diagnosis not present

## 2020-02-15 DIAGNOSIS — C50919 Malignant neoplasm of unspecified site of unspecified female breast: Secondary | ICD-10-CM | POA: Diagnosis not present

## 2020-02-15 DIAGNOSIS — G4733 Obstructive sleep apnea (adult) (pediatric): Secondary | ICD-10-CM | POA: Diagnosis not present

## 2020-02-15 DIAGNOSIS — E7849 Other hyperlipidemia: Secondary | ICD-10-CM | POA: Diagnosis not present

## 2022-02-26 IMAGING — PT NM PET TUM IMG RESTAG (PS) SKULL BASE T - THIGH
1 series · 4 of 4 positions shown · non-contrast
Comparison: 12/28/2018

CLINICAL DATA: Subsequent treatment strategy for right breast
cancer.

EXAM:
NUCLEAR MEDICINE PET SKULL BASE TO THIGH
TECHNIQUE: 9.4 mCi F-18 FDG was injected intravenously. Full-ring PET imaging
was performed from the skull base to thigh after the radiotracer. CT
data was obtained and used for attenuation correction and anatomic
localization.
Fasting blood glucose: 113 mg/dl

[Series 8095: results mm oncology reading · 5.0mm · 1.00mm/px · 4 of 4 slices shown]
[im 1/4]
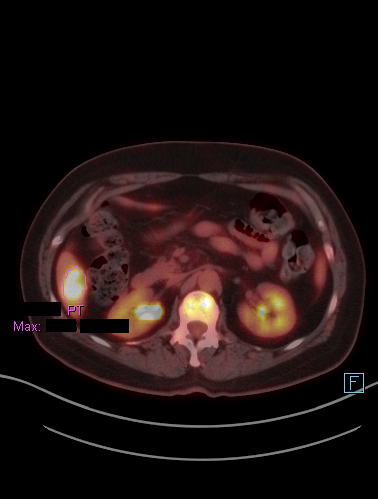
[im 2/4]
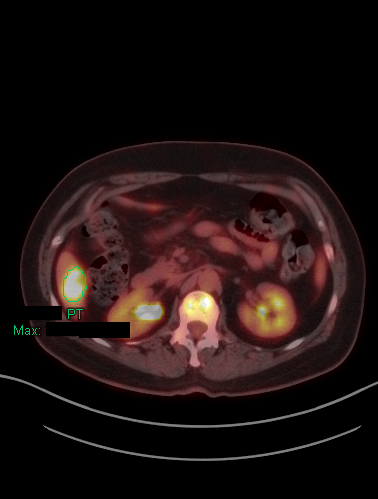
[im 3/4]
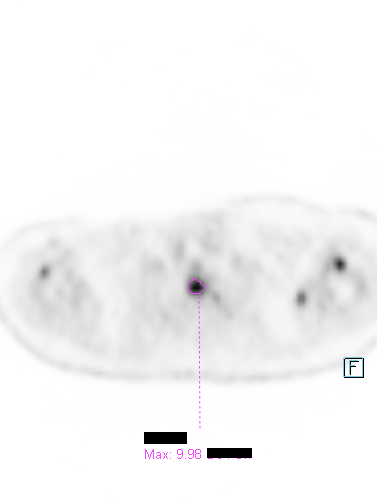
[im 4/4]
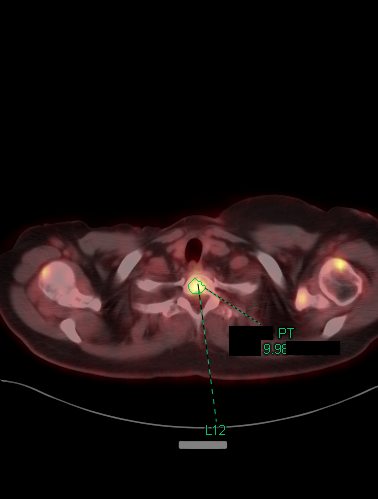

[4 of 4 positions shown; findings below may reference images not displayed]

FINDINGS: Mediastinal blood pool activity: SUV max

Liver activity: SUV max N/A

NECK: No significant abnormal hypermetabolic activity in this
region.

Incidental CT findings: none

CHEST: No significant abnormal hypermetabolic activity in this
region.

Incidental CT findings: Right mastectomy.

ABDOMEN/PELVIS: Scattered hypermetabolic lesions in the liver
compatible with new metastatic lesions. An index lesion inferiorly
in the right hepatic lobe measures 3.4 by 1.9 cm on image 107/4 and
has maximum SUV of 11.5.

Incidental CT findings: Mild abdominal aortic atherosclerotic
calcification.

SKELETON: Extensive and widespread osseous metastatic disease,
roughly similar distribution amount of tumor compared to the
12/28/2018 PET-CT. The lesion in the T1 vertebral body maximum SUV
of 10.0, previously 11.0.

Notable increase in sclerosis of the innumerable widespread
scattered bony lesions.

Incidental CT findings: None
IMPRESSION: 1. New scattered hypermetabolic hepatic metastatic lesions.
2. Overall similar distribution and appearance of widespread osseous
malignancy throughout the skeleton. However, on the CT data, there
is increased sclerosis in much of the skeleton likely corresponding
to treatment effect.
3.  Aortoiliac atherosclerotic vascular disease.
